# Patient Record
Sex: Male | Born: 1950 | Race: White | Hispanic: No | Smoking: Never smoker
Health system: Southern US, Community
[De-identification: ages and names within clinical notes are randomized; demographics above are authoritative.]

## PROBLEM LIST (undated history)

## (undated) DIAGNOSIS — I639 Cerebral infarction, unspecified: Secondary | ICD-10-CM

## (undated) DIAGNOSIS — F29 Unspecified psychosis not due to a substance or known physiological condition: Secondary | ICD-10-CM

## (undated) DIAGNOSIS — G629 Polyneuropathy, unspecified: Secondary | ICD-10-CM

## (undated) DIAGNOSIS — E785 Hyperlipidemia, unspecified: Secondary | ICD-10-CM

## (undated) DIAGNOSIS — I10 Essential (primary) hypertension: Secondary | ICD-10-CM

## (undated) DIAGNOSIS — E119 Type 2 diabetes mellitus without complications: Secondary | ICD-10-CM

## (undated) DIAGNOSIS — E11319 Type 2 diabetes mellitus with unspecified diabetic retinopathy without macular edema: Secondary | ICD-10-CM

## (undated) HISTORY — PX: BACK SURGERY: SHX140

---

## 2006-03-08 ENCOUNTER — Inpatient Hospital Stay: Payer: Self-pay | Admitting: Internal Medicine

## 2006-03-08 ENCOUNTER — Other Ambulatory Visit: Payer: Self-pay

## 2006-03-31 ENCOUNTER — Inpatient Hospital Stay: Payer: Self-pay | Admitting: Internal Medicine

## 2007-01-22 ENCOUNTER — Emergency Department: Payer: Self-pay | Admitting: Emergency Medicine

## 2007-09-15 ENCOUNTER — Ambulatory Visit: Payer: Self-pay

## 2007-10-16 ENCOUNTER — Other Ambulatory Visit: Payer: Self-pay

## 2007-10-16 ENCOUNTER — Emergency Department: Payer: Self-pay | Admitting: Emergency Medicine

## 2008-02-17 ENCOUNTER — Other Ambulatory Visit: Payer: Self-pay

## 2008-02-17 ENCOUNTER — Emergency Department: Payer: Self-pay | Admitting: Internal Medicine

## 2012-10-16 ENCOUNTER — Ambulatory Visit: Payer: Self-pay | Admitting: Family Medicine

## 2013-03-27 ENCOUNTER — Ambulatory Visit: Payer: Self-pay | Admitting: Ophthalmology

## 2013-04-09 ENCOUNTER — Ambulatory Visit: Payer: Self-pay | Admitting: Ophthalmology

## 2014-11-07 NOTE — Op Note (Signed)
PATIENT NAME:  Calvin Bates, Calvin Bates MR#:  [id number] DATE OF BIRTH:  Jun 11, 1951  DATE OF PROCEDURE:  04/09/2013  PREOPERATIVE DIAGNOSIS: Visually significant cataract of the left eye.   POSTOPERATIVE DIAGNOSIS: Visually significant cataract of the left eye.   OPERATIVE PROCEDURE: Cataract extraction by phacoemulsification with implant of intraocular lens to the left eye.   SURGEON: Galen ManilaWilliam Tariya Morrissette, MD  ANESTHESIA:  1. Managed anesthesia care.  2. 50-50 mixture of 0.75% bupivacaine and 4% Xylocaine given as a retrobulbar block.   COMPLICATIONS: None.   TECHNIQUE:  Stop and chop.  DESCRIPTION OF PROCEDURE: The patient was examined and consented for this procedure in the preoperative holding area and then brought back to the Operating Room where the anesthesia team employed managed anesthesia care.  3.5 milliliters of the aforementioned mixture were placed in the left orbit on an Atkinson needle without complication. The left eye was then prepped and draped in the usual sterile ophthalmic fashion. A lid speculum was placed. The side-port blade was used to create a paracentesis and the anterior chamber was filled with viscoelastic. The keratome was used to create a near clear corneal incision. The continuous curvilinear capsulorrhexis was performed with a cystotome followed by the capsulorrhexis forceps. Hydrodissection and hydrodelineation were carried out with BSS on a blunt cannula. The lens was removed in a stop and chop technique. The remaining cortical material was removed with the irrigation-aspiration handpiece. The capsular bag was inflated with viscoelastic and the Tecnis ZCB00 19.5-diopter lens, serial number [id number] was placed in the capsular bag without complication. The remaining viscoelastic was removed from the eye with the irrigation-aspiration handpiece. The wounds were hydrated. The anterior chamber was flushed with Miostat and the eye was inflated to a physiologic pressure. 0.1 mL of  cefuroxime concentration 10 mg/mL was placed in the anterior chamber. The wounds were found to be water tight. The eye was dressed with Vigamox followed by Maxitrol ointment and a protective shield was placed. The patient will followup with me in one day.     ____________________________ Jerilee FieldWilliam L. Dejon Jungman, MD wlp:dmm D: 04/09/2013 18:49:58 ET T: 04/09/2013 19:19:16 ET JOB#: [id number]  cc: Sheretta Grumbine L. Donivan Thammavong, MD, <Dictator> Jerilee FieldWILLIAM L Marily Konczal MD ELECTRONICALLY SIGNED 04/10/2013 17:26

## 2014-11-25 ENCOUNTER — Encounter: Payer: Self-pay | Admitting: Podiatry

## 2014-11-25 ENCOUNTER — Ambulatory Visit (INDEPENDENT_AMBULATORY_CARE_PROVIDER_SITE_OTHER): Payer: Medicare Other | Admitting: Podiatry

## 2014-11-25 VITALS — Ht 69.0 in | Wt 197.4 lb

## 2014-11-25 DIAGNOSIS — M79676 Pain in unspecified toe(s): Secondary | ICD-10-CM

## 2014-11-25 DIAGNOSIS — B351 Tinea unguium: Secondary | ICD-10-CM | POA: Diagnosis not present

## 2014-11-25 NOTE — Patient Instructions (Signed)
Diabetes and Foot Care Diabetes may cause you to have problems because of poor blood supply (circulation) to your feet and legs. This may cause the skin on your feet to become thinner, break easier, and heal more slowly. Your skin may become dry, and the skin may peel and crack. You may also have nerve damage in your legs and feet causing decreased feeling in them. You may not notice minor injuries to your feet that could lead to infections or more serious problems. Taking care of your feet is one of the most important things you can do for yourself.  HOME CARE INSTRUCTIONS  Wear shoes at all times, even in the house. Do not go barefoot. Bare feet are easily injured.  Check your feet daily for blisters, cuts, and redness. If you cannot see the bottom of your feet, use a mirror or ask someone for help.  Wash your feet with warm water (do not use hot water) and mild soap. Then pat your feet and the areas between your toes until they are completely dry. Do not soak your feet as this can dry your skin.  Apply a moisturizing lotion or petroleum jelly (that does not contain alcohol and is unscented) to the skin on your feet and to dry, brittle toenails. Do not apply lotion between your toes.  Trim your toenails straight across. Do not dig under them or around the cuticle. File the edges of your nails with an emery board or nail file.  Do not cut corns or calluses or try to remove them with medicine.  Wear clean socks or stockings every day. Make sure they are not too tight. Do not wear knee-high stockings since they may decrease blood flow to your legs.  Wear shoes that fit properly and have enough cushioning. To break in new shoes, wear them for just a few hours a day. This prevents you from injuring your feet. Always look in your shoes before you put them on to be sure there are no objects inside.  Do not cross your legs. This may decrease the blood flow to your feet.  If you find a minor scrape,  cut, or break in the skin on your feet, keep it and the skin around it clean and dry. These areas may be cleansed with mild soap and water. Do not cleanse the area with peroxide, alcohol, or iodine.  When you remove an adhesive bandage, be sure not to damage the skin around it.  If you have a wound, look at it several times a day to make sure it is healing.  Do not use heating pads or hot water bottles. They may burn your skin. If you have lost feeling in your feet or legs, you may not know it is happening until it is too late.  Make sure your health care provider performs a complete foot exam at least annually or more often if you have foot problems. Report any cuts, sores, or bruises to your health care provider immediately. SEEK MEDICAL CARE IF:   You have an injury that is not healing.  You have cuts or breaks in the skin.  You have an ingrown nail.  You notice redness on your legs or feet.  You feel burning or tingling in your legs or feet.  You have pain or cramps in your legs and feet.  Your legs or feet are numb.  Your feet always feel cold. SEEK IMMEDIATE MEDICAL CARE IF:   There is increasing redness,   swelling, or pain in or around a wound.  There is a red line that goes up your leg.  Pus is coming from a wound.  You develop a fever or as directed by your health care provider.  You notice a bad smell coming from an ulcer or wound. Document Released: 07/01/2000 Document Revised: 03/06/2013 Document Reviewed: 12/11/2012 Exit[employer] Patient Information 2015 [employer]. This information is not intended to replace advice given to you by your health care provider. Make sure you discuss any questions you have with your health care provider.  

## 2014-11-25 NOTE — Progress Notes (Signed)
   Subjective:    Patient ID: Calvin Bates, male    DOB: 08/07/1950, 64 y.o.   MRN: [id number]  HPI 64 year old male presents the office today with complaints of painful, elongated toenails which she is unable to trim himself. He denies any redness or drainage from the nail sites. He states his last blood sugar was 123. Denies a history of ulceration. No claudication symptoms. Last HbA1c 6.2. No other complaints at this time.   Review of Systems  Gastrointestinal: Positive for constipation.       Bloating   Musculoskeletal:       Back pain Muscle pain  All other systems reviewed and are negative.      Objective:   Physical Exam AAO 3, NAD DP/PT pulses palpable, CRT less than 3 seconds Protective sensation intact with Simms Weinstein monofilament, Achilles tendon reflex intact. Nails are hypertrophic, dystrophic, elongated, brittle, discolored 10. There is no surrounding erythema or drainage on the nail sites. There is tenderness to palpation overlying nails 1-5 bilaterally. No open lesions or pre-ulcerative lesions identified bilaterally. No other areas of tenderness to bilateral lower extremities. No overlying edema, erythema, increase in warmth. MMT 5/5, ROM WNL No pain with calf compression, swelling, warmth, erythema.    Assessment & Plan:   64 year old male with symptomatic onychomycosis -Treatment options were discussed including alternatives, risks, complications. -Nail sharply debrided 10 without complication/bleeding -Discussed the importance of daily foot inspection -Follow-up in 3 months or sooner if any problems are to arise. In the meantime, call the office with any questions, concerns, change in symptoms.

## 2015-02-24 ENCOUNTER — Ambulatory Visit: Payer: Medicare Other

## 2015-02-26 ENCOUNTER — Ambulatory Visit (INDEPENDENT_AMBULATORY_CARE_PROVIDER_SITE_OTHER): Payer: Medicare Other | Admitting: Podiatry

## 2015-02-26 ENCOUNTER — Encounter: Payer: Self-pay | Admitting: Podiatry

## 2015-02-26 DIAGNOSIS — M79676 Pain in unspecified toe(s): Secondary | ICD-10-CM | POA: Diagnosis not present

## 2015-02-26 DIAGNOSIS — B351 Tinea unguium: Secondary | ICD-10-CM

## 2015-02-26 NOTE — Progress Notes (Signed)
Patient ID: Calvin Bates, male   DOB: 1950/12/16, 64 y.o.   MRN: [id number]  Subjective: 64 y.o. returns the office today for painful, elongated, thickened toenails which he is unable to trim himself. Denies any redness or drainage around the nails. Denies any acute changes since last appointment and no new complaints today. Denies any systemic complaints such as fevers, chills, nausea, vomiting.   Objective: AAO 3, NAD DP/PT pulses palpable, CRT less than 3 seconds Nails hypertrophic, dystrophic, elongated, brittle, discolored 10. There is tenderness overlying the nails 1-5 bilaterally. There is no surrounding erythema or drainage along the nail sites. No open lesions or pre-ulcerative lesions are identified. No other areas of tenderness bilateral lower extremities. No overlying edema, erythema, increased warmth. No pain with calf compression, swelling, warmth, erythema.  Assessment: Patient presents with symptomatic onychomycosis  Plan: -Treatment options including alternatives, risks, complications were discussed -Nails sharply debrided 10 without complication/bleeding. -Discussed daily foot inspection. If there are any changes, to call the office immediately.  -Follow-up in 3 months or sooner if any problems are to arise. In the meantime, encouraged to call the office with any questions, concerns, changes symptoms.   Ovid Curd, DPM

## 2015-04-06 ENCOUNTER — Telehealth: Payer: Self-pay

## 2015-04-09 NOTE — Telephone Encounter (Signed)
error 

## 2015-06-04 ENCOUNTER — Ambulatory Visit (INDEPENDENT_AMBULATORY_CARE_PROVIDER_SITE_OTHER): Payer: Medicare Other | Admitting: Podiatry

## 2015-06-04 DIAGNOSIS — B351 Tinea unguium: Secondary | ICD-10-CM

## 2015-06-04 DIAGNOSIS — M79676 Pain in unspecified toe(s): Secondary | ICD-10-CM | POA: Diagnosis not present

## 2015-06-04 NOTE — Progress Notes (Signed)
Patient ID: Calvin HomansDonnie V Bates, male   DOB: 06/13/1951, 64 y.o.   MRN: [id number]  Subjective: 64 y.o. returns the office today for painful, elongated, thickened toenails which he is unable to trim himself. Denies any redness or drainage around the nails. Denies any acute changes since last appointment and no new complaints today. Denies any systemic complaints such as fevers, chills, nausea, vomiting.   Objective: AAO 3, NAD DP/PT pulses palpable, CRT less than 3 seconds Nails hypertrophic, dystrophic, elongated, brittle, discolored 10. There is tenderness overlying the nails 1-5 bilaterally. There is no surrounding erythema or drainage along the nail sites. Three is ecchymosis to the left and right 3rd toe without any pain, swelling, erythema.  No open lesions or pre-ulcerative lesions are identified. No other areas of tenderness bilateral lower extremities. No overlying edema, erythema, increased warmth. No pain with calf compression, swelling, warmth, erythema.  Assessment: Patient presents with symptomatic onychomycosis; toe contusion.   Plan: -Treatment options including alternatives, risks, complications were discussed -Nails sharply debrided 10 without complication/bleeding. -Recommended x-ray due to bruising but declined.  -Discussed daily foot inspection. If there are any changes, to call the office immediately.  -Follow-up in 3 months or sooner if any problems are to arise. In the meantime, encouraged to call the office with any questions, concerns, changes symptoms.  Ovid CurdMatthew Pasqual Farias, DPM

## 2015-09-08 ENCOUNTER — Ambulatory Visit (INDEPENDENT_AMBULATORY_CARE_PROVIDER_SITE_OTHER): Payer: Medicare Other | Admitting: Sports Medicine

## 2015-09-08 ENCOUNTER — Encounter: Payer: Self-pay | Admitting: Sports Medicine

## 2015-09-08 DIAGNOSIS — E1140 Type 2 diabetes mellitus with diabetic neuropathy, unspecified: Secondary | ICD-10-CM | POA: Diagnosis not present

## 2015-09-08 DIAGNOSIS — M79676 Pain in unspecified toe(s): Secondary | ICD-10-CM | POA: Diagnosis not present

## 2015-09-08 DIAGNOSIS — B351 Tinea unguium: Secondary | ICD-10-CM | POA: Diagnosis not present

## 2015-09-08 NOTE — Progress Notes (Signed)
Patient ID: Calvin Bates, male   DOB: 1951-05-06, 65 y.o.   MRN: [id number] Subjective: Calvin Bates is a 65 y.o. fe/male patient with history of type 2 diabetes who presents to office today complaining of long, painful nails  while ambulating in shoes; unable to trim. Patient states that the glucose reading this morning was 135 mg/dl. Patient denies any new changes in medication or new problems. Patient denies any new cramping, numbness, burning or tingling in the legs.  There are no active problems to display for this patient.  Current Outpatient Prescriptions on File Prior to Visit  Medication Sig Dispense Refill  . amLODipine (NORVASC) 5 MG tablet     . aspirin EC 81 MG tablet Take by mouth.    . Calcium-Magnesium-Vitamin D (CALCIUM 500 PO) Take 500 mg by mouth 2 (two) times daily.    . Hypromellose (NATURAL BALANCE TEARS OP) Apply 1 drop to eye 4 (four) times daily.    Marland Kitchen lisinopril (PRINIVIL,ZESTRIL) 40 MG tablet     . lovastatin (MEVACOR) 40 MG tablet     . Magnesium 250 MG TABS Take by mouth.    . metFORMIN (GLUCOPHAGE) 500 MG tablet     . metoprolol tartrate (LOPRESSOR) 25 MG tablet     . Multiple Vitamin (MULTIVITAMIN) capsule Take 1 capsule by mouth daily.    Marland Kitchen OLANZapine (ZYPREXA) 5 MG tablet Take by mouth.    . omega-3 acid ethyl esters (LOVAZA) 1 G capsule     . tamsulosin (FLOMAX) 0.4 MG CAPS capsule Take by mouth.    . thiamine 100 MG tablet Take by mouth.     No current facility-administered medications on file prior to visit.   No Known Allergies  Objective: General: Patient is awake, alert, and oriented x 3 and in no acute distress.  Integument: Skin is warm, dry and supple bilateral. Nails are tender, long, thickened and  dystrophic with subungual debris, consistent with onychomycosis, 1-5 bilateral. No signs of infection. No open lesions or preulcerative lesions present bilateral. Remaining integument unremarkable.  Vasculature:  Dorsalis Pedis pulse 2/4  bilateral. Posterior Tibial pulse  1/4 bilateral.  Capillary fill time <3 sec 1-5 bilateral. Scant hair growth to the level of the digits. Temperature gradient within normal limits. No varicosities present bilateral. No edema present bilateral.   Neurology: The patient has intact sensation measured with a 5.07/10g Semmes Weinstein Monofilament at all pedal sites bilateral . Vibratory sensation diminished bilateral with tuning fork. No Babinski sign present bilateral.   Musculoskeletal: Mild asymptomatic hammertoe pedal deformities noted bilateral. Muscular strength 5/5 in all lower extremity muscular groups bilateral without pain on range of motion . No tenderness with calf compression bilateral.  Assessment and Plan: Problem List Items Addressed This Visit    None    Visit Diagnoses    Dermatophytosis of nail    -  Primary    Pain of toe, unspecified laterality        Type 2 diabetes mellitus with diabetic neuropathy, without long-term current use of insulin (HCC)          -Examined patient. -Discussed and educated patient on diabetic foot care, especially with  regards to the vascular, neurological and musculoskeletal systems.  -Stressed the importance of good glycemic control and the detriment of not  controlling glucose levels in relation to the foot. -Mechanically debrided all nails 1-5 bilateral using sterile nail nipper and filed with dremel without incident  -Answered all patient questions -Patient to return  in 3 months for at risk foot care -Patient advised to call the office if any problems or questions arise in the meantime.  Asencion Islam, DPM

## 2015-12-08 ENCOUNTER — Ambulatory Visit (INDEPENDENT_AMBULATORY_CARE_PROVIDER_SITE_OTHER): Payer: Medicare Other | Admitting: Sports Medicine

## 2015-12-08 ENCOUNTER — Encounter: Payer: Self-pay | Admitting: Sports Medicine

## 2015-12-08 DIAGNOSIS — B351 Tinea unguium: Secondary | ICD-10-CM | POA: Diagnosis not present

## 2015-12-08 DIAGNOSIS — M79676 Pain in unspecified toe(s): Secondary | ICD-10-CM

## 2015-12-08 DIAGNOSIS — E1140 Type 2 diabetes mellitus with diabetic neuropathy, unspecified: Secondary | ICD-10-CM

## 2015-12-08 NOTE — Progress Notes (Signed)
Patient ID: Calvin Bates, male   DOB: 06/20/1951, 65 y.o.   MRN: [id number]  Subjective: Calvin Bates is a 65 y.o. male patient with history of type 2 diabetes who presents to office today complaining of long, painful nails  while ambulating in shoes; unable to trim. Patient states that the glucose reading this morning was 114 mg/dl. Patient denies any new changes in medication or new problems. Patient denies any new cramping, numbness, burning or tingling in the legs.  There are no active problems to display for this patient.  Current Outpatient Prescriptions on File Prior to Visit  Medication Sig Dispense Refill  . amLODipine (NORVASC) 5 MG tablet     . aspirin EC 81 MG tablet Take by mouth.    . Calcium-Magnesium-Vitamin D (CALCIUM 500 PO) Take 500 mg by mouth 2 (two) times daily.    . Hypromellose (NATURAL BALANCE TEARS OP) Apply 1 drop to eye 4 (four) times daily.    Marland Kitchen. lisinopril (PRINIVIL,ZESTRIL) 40 MG tablet     . lovastatin (MEVACOR) 40 MG tablet     . Magnesium 250 MG TABS Take by mouth.    . metFORMIN (GLUCOPHAGE) 500 MG tablet     . metoprolol tartrate (LOPRESSOR) 25 MG tablet     . Multiple Vitamin (MULTIVITAMIN) capsule Take 1 capsule by mouth daily.    Marland Kitchen. OLANZapine (ZYPREXA) 5 MG tablet Take by mouth.    . omega-3 acid ethyl esters (LOVAZA) 1 G capsule     . tamsulosin (FLOMAX) 0.4 MG CAPS capsule Take by mouth.    . thiamine 100 MG tablet Take by mouth.     No current facility-administered medications on file prior to visit.   No Known Allergies  Objective: General: Patient is awake, alert, and oriented x 3 and in no acute distress.  Integument: Skin is warm, dry and supple bilateral. Nails are tender, long, thickened and  dystrophic with subungual debris, consistent with onychomycosis, 1-5 bilateral. No signs of infection. No open lesions or preulcerative lesions present bilateral. Remaining integument unremarkable.  Vasculature:  Dorsalis Pedis pulse 2/4  bilateral. Posterior Tibial pulse  1/4 bilateral.  Capillary fill time <3 sec 1-5 bilateral. Scant hair growth to the level of the digits. Temperature gradient within normal limits. No varicosities present bilateral. No edema present bilateral.   Neurology: The patient has intact sensation measured with a 5.07/10g Semmes Weinstein Monofilament at all pedal sites bilateral . Vibratory sensation diminished bilateral with tuning fork. No Babinski sign present bilateral.   Musculoskeletal: Mild asymptomatic hammertoe pedal deformities noted bilateral. Muscular strength 5/5 in all lower extremity muscular groups bilateral without pain on range of motion . No tenderness with calf compression bilateral.  Assessment and Plan: Problem List Items Addressed This Visit    None    Visit Diagnoses    Dermatophytosis of nail    -  Primary    Pain of toe, unspecified laterality        Type 2 diabetes mellitus with diabetic neuropathy, without long-term current use of insulin (HCC)          -Examined patient. -Discussed and educated patient on diabetic foot care, especially with  regards to the vascular, neurological and musculoskeletal systems.  -Stressed the importance of good glycemic control and the detriment of not  controlling glucose levels in relation to the foot. -Mechanically debrided all nails 1-5 bilateral using sterile nail nipper and filed with dremel without incident  -Answered all patient questions -Patient to  return  in 3 months for at risk foot care -Patient advised to call the office if any problems or questions arise in the meantime.  Landis Martins, DPM

## 2016-03-11 ENCOUNTER — Encounter: Payer: Self-pay | Admitting: Sports Medicine

## 2016-03-11 ENCOUNTER — Ambulatory Visit (INDEPENDENT_AMBULATORY_CARE_PROVIDER_SITE_OTHER): Payer: Medicare Other | Admitting: Sports Medicine

## 2016-03-11 DIAGNOSIS — M79676 Pain in unspecified toe(s): Secondary | ICD-10-CM | POA: Diagnosis not present

## 2016-03-11 DIAGNOSIS — B351 Tinea unguium: Secondary | ICD-10-CM

## 2016-03-11 DIAGNOSIS — E1140 Type 2 diabetes mellitus with diabetic neuropathy, unspecified: Secondary | ICD-10-CM

## 2016-03-11 NOTE — Progress Notes (Signed)
Patient ID: Calvin Bates, male   DOB: 03/10/1951, 65 y.o.   MRN: [id number]  Subjective: Calvin Bates is a 65 y.o. male patient with history of type 2 diabetes who presents to office today complaining of long, painful nails  while ambulating in shoes; unable to trim. Patient states that the glucose reading this morning was not recorded. Patient denies any new changes in medication or new problems. Patient denies any new cramping, numbness, burning or tingling in the legs.  There are no active problems to display for this patient.  Current Outpatient Prescriptions on File Prior to Visit  Medication Sig Dispense Refill  . amLODipine (NORVASC) 5 MG tablet     . aspirin EC 81 MG tablet Take by mouth.    . Calcium-Magnesium-Vitamin D (CALCIUM 500 PO) Take 500 mg by mouth 2 (two) times daily.    . Hypromellose (NATURAL BALANCE TEARS OP) Apply 1 drop to eye 4 (four) times daily.    Marland Kitchen. lisinopril (PRINIVIL,ZESTRIL) 40 MG tablet     . lovastatin (MEVACOR) 40 MG tablet     . Magnesium 250 MG TABS Take by mouth.    . metFORMIN (GLUCOPHAGE) 500 MG tablet     . metoprolol tartrate (LOPRESSOR) 25 MG tablet     . Multiple Vitamin (MULTIVITAMIN) capsule Take 1 capsule by mouth daily.    Marland Kitchen. OLANZapine (ZYPREXA) 5 MG tablet Take by mouth.    . omega-3 acid ethyl esters (LOVAZA) 1 G capsule     . tamsulosin (FLOMAX) 0.4 MG CAPS capsule Take by mouth.    . thiamine 100 MG tablet Take by mouth.     No current facility-administered medications on file prior to visit.    No Known Allergies  Objective: General: Patient is awake, alert, and oriented x 3 and in no acute distress.  Integument: Skin is warm, dry and supple bilateral. Nails are tender, long, thickened and  dystrophic with subungual debris, consistent with onychomycosis, 1-5 bilateral. No signs of infection. No open lesions or preulcerative lesions present bilateral. Remaining integument unremarkable.  Vasculature:  Dorsalis Pedis pulse 2/4  bilateral. Posterior Tibial pulse  1/4 bilateral.  Capillary fill time <3 sec 1-5 bilateral. Scant hair growth to the level of the digits. Temperature gradient within normal limits. No varicosities present bilateral. No edema present bilateral.   Neurology: The patient has intact sensation measured with a 5.07/10g Semmes Weinstein Monofilament at all pedal sites bilateral . Vibratory sensation diminished bilateral with tuning fork. No Babinski sign present bilateral.   Musculoskeletal: Mild asymptomatic hammertoe pedal deformities noted bilateral. Muscular strength 5/5 in all lower extremity muscular groups bilateral without pain on range of motion . No tenderness with calf compression bilateral.  Assessment and Plan: Problem List Items Addressed This Visit    None    Visit Diagnoses    Dermatophytosis of nail    -  Primary   Pain of toe, unspecified laterality       Type 2 diabetes mellitus with diabetic neuropathy, without long-term current use of insulin (HCC)         -Examined patient. -Discussed and educated patient on diabetic foot care, especially with  regards to the vascular, neurological and musculoskeletal systems.  -Stressed the importance of good glycemic control and the detriment of not  controlling glucose levels in relation to the foot. -Mechanically debrided all nails 1-5 bilateral using sterile nail nipper and filed with dremel without incident  -Answered all patient questions -Patient to return  in 3 months for at risk foot care -Patient advised to call the office if any problems or questions arise in the meantime.  Asencion Islam, DPM

## 2016-06-17 ENCOUNTER — Ambulatory Visit (INDEPENDENT_AMBULATORY_CARE_PROVIDER_SITE_OTHER): Payer: Medicare Other | Admitting: Podiatry

## 2016-06-17 ENCOUNTER — Encounter: Payer: Self-pay | Admitting: Podiatry

## 2016-06-17 ENCOUNTER — Ambulatory Visit: Payer: Medicare Other | Admitting: Podiatry

## 2016-06-17 DIAGNOSIS — M79609 Pain in unspecified limb: Secondary | ICD-10-CM

## 2016-06-17 DIAGNOSIS — L600 Ingrowing nail: Secondary | ICD-10-CM | POA: Diagnosis not present

## 2016-06-17 DIAGNOSIS — B351 Tinea unguium: Secondary | ICD-10-CM | POA: Diagnosis not present

## 2016-06-17 DIAGNOSIS — L608 Other nail disorders: Secondary | ICD-10-CM | POA: Diagnosis not present

## 2016-06-17 DIAGNOSIS — L03039 Cellulitis of unspecified toe: Secondary | ICD-10-CM | POA: Diagnosis not present

## 2016-06-17 DIAGNOSIS — E0843 Diabetes mellitus due to underlying condition with diabetic autonomic (poly)neuropathy: Secondary | ICD-10-CM

## 2016-06-17 DIAGNOSIS — M79676 Pain in unspecified toe(s): Secondary | ICD-10-CM | POA: Diagnosis not present

## 2016-06-17 DIAGNOSIS — L603 Nail dystrophy: Secondary | ICD-10-CM

## 2016-06-17 NOTE — Patient Instructions (Addendum)
ANTIBACTERIAL SOAP INSTRUCTIONS  THE DAY AFTER PROCEDURE  Please follow the instructions your doctor has marked.   Shower as usual. Before getting out, place a drop of antibacterial liquid soap (Dial) on a wet, clean washcloth.  Gently wipe washcloth over affected area.  Afterward, rinse the area with warm water.  Blot the area dry with a soft cloth and cover with antibiotic ointment (neosporin, polysporin, bacitracin) and band aid or gauze and tape  Place 3-4 drops of antibacterial liquid soap in a quart of warm tap water.  Submerge foot into water for 20 minutes.  If bandage was applied after your procedure, leave on to allow for easy lift off, then remove and continue with soak for the remaining time.  Next, blot area dry with a soft cloth and cover with a bandage.  Apply other medications as directed by your doctor, such as cortisporin otic solution (eardrops) or neosporin antibiotic ointmentt

## 2016-06-19 NOTE — Progress Notes (Signed)
SUBJECTIVE Patient with a history of diabetes mellitus presents to office today complaining of elongated, thickened nails. Pain while ambulating in shoes. Patient is unable to trim their own nails.  Patient also has a new complaint of pain to the medial aspect of the second toenail right foot. Patient is concerned that he possibly has an ingrown toenail. Patient states that it is painful to walk.  No Known Allergies  OBJECTIVE General Patient is awake, alert, and oriented x 3 and in no acute distress. Derm incurvated toenail noted to the second digit medial aspect right foot. Localized erythema noted to the medial nail fold suggestive of a paronychia formation. Skin is dry and supple bilateral. Negative open lesions or macerations. Remaining integument unremarkable. Nails are tender, long, thickened and dystrophic with subungual debris, consistent with onychomycosis, 1-5 bilateral. No signs of infection noted. Vasc  DP and PT pedal pulses palpable bilaterally. Temperature gradient within normal limits.  Neuro Epicritic and protective threshold sensation diminished bilaterally.  Musculoskeletal Exam pain on palpation noted to the medial aspect of the second digit medial nail fold suggestive of ingrowing nail. No symptomatic pedal deformities noted bilateral. Muscular strength within normal limits.  ASSESSMENT 1. Diabetes Mellitus w/ peripheral neuropathy 2. Onychomycosis of nail due to dermatophyte bilateral 3. Pain in foot bilateral 4. Paronychia with ingrowing nail second digit right foot medial border 5. Pain in second digit right foot  PLAN OF CARE 1. Patient evaluated today. 2. Instructed to maintain good pedal hygiene and foot care. Stressed importance of controlling blood sugar.  3. Mechanical debridement of nails 1-5 bilaterally performed using a nail nipper. Filed with dremel without incident.  4. Partial permanent nail avulsion was performed to the medial aspect of the second digit  of the right foot using 330 seconds applications of phenol followed by alcohol flush. Prior to clinical procedure, digital block was performed using a 50-50 mixture of 2% lidocaine plain with 0.5% Marcaine plain. Light sterile dressing was applied. 5. Return to clinic in 2 weeks for ingrown follow-up evaluation   Felecia ShellingBrent M. Cainan Trull, DPM [facility]  Dr. Felecia ShellingBrent M. Fahmida Jurich, DPM   [address]                                        [place], [place] [address]                Office [phone]  Fax [phone]

## 2016-07-01 ENCOUNTER — Ambulatory Visit (INDEPENDENT_AMBULATORY_CARE_PROVIDER_SITE_OTHER): Payer: Medicare Other | Admitting: Podiatry

## 2016-07-01 DIAGNOSIS — S91109D Unspecified open wound of unspecified toe(s) without damage to nail, subsequent encounter: Secondary | ICD-10-CM

## 2016-07-01 DIAGNOSIS — M79676 Pain in unspecified toe(s): Secondary | ICD-10-CM

## 2016-07-01 DIAGNOSIS — S91209D Unspecified open wound of unspecified toe(s) with damage to nail, subsequent encounter: Secondary | ICD-10-CM | POA: Diagnosis not present

## 2016-07-03 NOTE — Progress Notes (Signed)

## 2016-10-03 ENCOUNTER — Ambulatory Visit (INDEPENDENT_AMBULATORY_CARE_PROVIDER_SITE_OTHER): Payer: Medicare Other | Admitting: Podiatry

## 2016-10-03 ENCOUNTER — Encounter: Payer: Self-pay | Admitting: Podiatry

## 2016-10-03 DIAGNOSIS — M79609 Pain in unspecified limb: Secondary | ICD-10-CM | POA: Diagnosis not present

## 2016-10-03 DIAGNOSIS — B351 Tinea unguium: Secondary | ICD-10-CM | POA: Diagnosis not present

## 2016-10-03 DIAGNOSIS — E0843 Diabetes mellitus due to underlying condition with diabetic autonomic (poly)neuropathy: Secondary | ICD-10-CM

## 2016-10-03 NOTE — Progress Notes (Signed)
Complaint:  Visit Type: Patient returns to my office for continued preventative foot care services. Complaint: Patient states" my nails have grown long and thick and become painful to walk and wear shoes" . The patient presents for preventative foot care services. No changes to ROS.  Patient is diabetic.  Podiatric Exam: Vascular: dorsalis pedis and posterior tibial pulses are palpable bilateral. Capillary return is immediate. Temperature gradient is WNL. Skin turgor WNL  Sensorium: Normal Semmes Weinstein monofilament test. Normal tactile sensation bilaterally. Nail Exam: Pt has thick disfigured discolored nails with subungual debris noted bilateral entire nail hallux through fifth toenails Ulcer Exam: There is no evidence of ulcer or pre-ulcerative changes or infection. Orthopedic Exam: Muscle tone and strength are WNL. No limitations in general ROM. No crepitus or effusions noted. Foot type and digits show no abnormalities. Bony prominences are unremarkable. Skin: No Porokeratosis. No infection or ulcers  Diagnosis:  Onychomycosis, , Pain in right toe, pain in left toes  Treatment & Plan Procedures and Treatment: Consent by patient was obtained for treatment procedures. The patient understood the discussion of treatment and procedures well. All questions were answered thoroughly reviewed. Debridement of mycotic and hypertrophic toenails, 1 through 5 bilateral and clearing of subungual debris. No ulceration, no infection noted.  Return Visit-Office Procedure: Patient instructed to return to the office for a follow up visit 3 months for continued evaluation and treatment.    Helane GuntherGregory Anapaula Severt DPM

## 2017-01-09 ENCOUNTER — Encounter: Payer: Self-pay | Admitting: Podiatry

## 2017-01-09 ENCOUNTER — Ambulatory Visit (INDEPENDENT_AMBULATORY_CARE_PROVIDER_SITE_OTHER): Payer: Medicare Other | Admitting: Podiatry

## 2017-01-09 DIAGNOSIS — B351 Tinea unguium: Secondary | ICD-10-CM | POA: Diagnosis not present

## 2017-01-09 DIAGNOSIS — M79609 Pain in unspecified limb: Secondary | ICD-10-CM

## 2017-01-09 NOTE — Progress Notes (Signed)
Complaint:  Visit Type: Patient returns to my office for continued preventative foot care services. Complaint: Patient states" my nails have grown long and thick and become painful to walk and wear shoes" . The patient presents for preventative foot care services. No changes to ROS.  Patient is diabetic.  Podiatric Exam: Vascular: dorsalis pedis and posterior tibial pulses are palpable bilateral. Capillary return is immediate. Temperature gradient is WNL. Skin turgor WNL  Sensorium: Normal Semmes Weinstein monofilament test. Normal tactile sensation bilaterally. Nail Exam: Pt has thick disfigured discolored nails with subungual debris noted bilateral entire nail hallux through fifth toenails Ulcer Exam: There is no evidence of ulcer or pre-ulcerative changes or infection. Orthopedic Exam: Muscle tone and strength are WNL. No limitations in general ROM. No crepitus or effusions noted. Foot type and digits show no abnormalities. Bony prominences are unremarkable. Skin: No Porokeratosis. No infection or ulcers  Diagnosis:  Onychomycosis, , Pain in right toe, pain in left toes  Treatment & Plan Procedures and Treatment: Consent by patient was obtained for treatment procedures. The patient understood the discussion of treatment and procedures well. All questions were answered thoroughly reviewed. Debridement of mycotic and hypertrophic toenails, 1 through 5 bilateral and clearing of subungual debris. No ulceration, no infection noted.  Return Visit-Office Procedure: Patient instructed to return to the office for a follow up visit 3 months for continued evaluation and treatment.    Helane GuntherGregory Ilanna Deihl DPM

## 2017-04-10 ENCOUNTER — Ambulatory Visit (INDEPENDENT_AMBULATORY_CARE_PROVIDER_SITE_OTHER): Payer: Medicare Other | Admitting: Podiatry

## 2017-04-10 ENCOUNTER — Encounter: Payer: Self-pay | Admitting: Podiatry

## 2017-04-10 DIAGNOSIS — M79609 Pain in unspecified limb: Secondary | ICD-10-CM | POA: Diagnosis not present

## 2017-04-10 DIAGNOSIS — B351 Tinea unguium: Secondary | ICD-10-CM

## 2017-04-10 NOTE — Progress Notes (Signed)
Complaint:  Visit Type: Patient returns to my office for continued preventative foot care services. Complaint: Patient states" my nails have grown long and thick and become painful to walk and wear shoes" . The patient presents for preventative foot care services. No changes to ROS.  Patient is diabetic.  Podiatric Exam: Vascular: dorsalis pedis and posterior tibial pulses are palpable bilateral. Capillary return is immediate. Temperature gradient is WNL. Skin turgor WNL  Sensorium: Normal Semmes Weinstein monofilament test. Normal tactile sensation bilaterally. Nail Exam: Pt has thick disfigured discolored nails with subungual debris noted bilateral entire nail hallux through fifth toenails Ulcer Exam: There is no evidence of ulcer or pre-ulcerative changes or infection. Orthopedic Exam: Muscle tone and strength are WNL. No limitations in general ROM. No crepitus or effusions noted. Foot type and digits show no abnormalities. Bony prominences are unremarkable. Skin: No Porokeratosis. No infection or ulcers  Diagnosis:  Onychomycosis, , Pain in right toe, pain in left toes  Treatment & Plan Procedures and Treatment: Consent by patient was obtained for treatment procedures. The patient understood the discussion of treatment and procedures well. All questions were answered thoroughly reviewed. Debridement of mycotic and hypertrophic toenails, 1 through 5 bilateral and clearing of subungual debris. No ulceration, no infection noted.  Return Visit-Office Procedure: Patient instructed to return to the office for a follow up visit 3 months for continued evaluation and treatment.    Helane Gunther DPM

## 2017-07-10 ENCOUNTER — Ambulatory Visit: Payer: Medicare Other | Admitting: Podiatry

## 2017-09-30 ENCOUNTER — Other Ambulatory Visit: Payer: Self-pay

## 2017-09-30 ENCOUNTER — Encounter: Payer: Self-pay | Admitting: Emergency Medicine

## 2017-09-30 ENCOUNTER — Emergency Department: Payer: Medicare Other

## 2017-09-30 ENCOUNTER — Emergency Department
Admission: EM | Admit: 2017-09-30 | Discharge: 2017-09-30 | Disposition: A | Discharge status: Home / Self Care | Payer: Medicare Other | Attending: Emergency Medicine | Admitting: Emergency Medicine

## 2017-09-30 DIAGNOSIS — Z7982 Long term (current) use of aspirin: Secondary | ICD-10-CM | POA: Diagnosis not present

## 2017-09-30 DIAGNOSIS — Z79899 Other long term (current) drug therapy: Secondary | ICD-10-CM | POA: Insufficient documentation

## 2017-09-30 DIAGNOSIS — E1140 Type 2 diabetes mellitus with diabetic neuropathy, unspecified: Secondary | ICD-10-CM | POA: Insufficient documentation

## 2017-09-30 DIAGNOSIS — I10 Essential (primary) hypertension: Secondary | ICD-10-CM | POA: Diagnosis not present

## 2017-09-30 DIAGNOSIS — R079 Chest pain, unspecified: Secondary | ICD-10-CM

## 2017-09-30 DIAGNOSIS — Z8673 Personal history of transient ischemic attack (TIA), and cerebral infarction without residual deficits: Secondary | ICD-10-CM | POA: Diagnosis not present

## 2017-09-30 DIAGNOSIS — E113299 Type 2 diabetes mellitus with mild nonproliferative diabetic retinopathy without macular edema, unspecified eye: Secondary | ICD-10-CM | POA: Insufficient documentation

## 2017-09-30 DIAGNOSIS — J40 Bronchitis, not specified as acute or chronic: Secondary | ICD-10-CM | POA: Diagnosis not present

## 2017-09-30 DIAGNOSIS — Z7984 Long term (current) use of oral hypoglycemic drugs: Secondary | ICD-10-CM | POA: Diagnosis not present

## 2017-09-30 HISTORY — DX: Type 2 diabetes mellitus without complications: E11.9

## 2017-09-30 HISTORY — DX: Type 2 diabetes mellitus with unspecified diabetic retinopathy without macular edema: E11.319

## 2017-09-30 HISTORY — DX: Cerebral infarction, unspecified: I63.9

## 2017-09-30 HISTORY — DX: Polyneuropathy, unspecified: G62.9

## 2017-09-30 HISTORY — DX: Unspecified psychosis not due to a substance or known physiological condition: F29

## 2017-09-30 HISTORY — DX: Essential (primary) hypertension: I10

## 2017-09-30 HISTORY — DX: Hyperlipidemia, unspecified: E78.5

## 2017-09-30 LAB — CBC
HEMATOCRIT: 40.2 % (ref 40.0–52.0)
Hemoglobin: 13.5 g/dL (ref 13.0–18.0)
MCH: 29.2 pg (ref 26.0–34.0)
MCHC: 33.7 g/dL (ref 32.0–36.0)
MCV: 86.9 fL (ref 80.0–100.0)
Platelets: 210 10*3/uL (ref 150–440)
RBC: 4.63 MIL/uL (ref 4.40–5.90)
RDW: 13.3 % (ref 11.5–14.5)
WBC: 7.8 10*3/uL (ref 3.8–10.6)

## 2017-09-30 LAB — BASIC METABOLIC PANEL
ANION GAP: 10 (ref 5–15)
BUN: 21 mg/dL — AB (ref 6–20)
CHLORIDE: 105 mmol/L (ref 101–111)
CO2: 23 mmol/L (ref 22–32)
Calcium: 9.5 mg/dL (ref 8.9–10.3)
Creatinine, Ser: 1.36 mg/dL — ABNORMAL HIGH (ref 0.61–1.24)
GFR, EST NON AFRICAN AMERICAN: 52 mL/min — AB (ref >60)
Glucose, Bld: 188 mg/dL — ABNORMAL HIGH (ref 65–99)
POTASSIUM: 4 mmol/L (ref 3.5–5.1)
Sodium: 138 mmol/L (ref 135–145)

## 2017-09-30 LAB — TROPONIN I: Troponin I: <0.03 ng/mL (ref <0.03)

## 2017-09-30 MED ORDER — IPRATROPIUM-ALBUTEROL 0.5-2.5 (3) MG/3ML IN SOLN
3.0000 mL | Freq: Once | RESPIRATORY_TRACT | Status: AC
  Administered 2017-09-30: 3 mL via RESPIRATORY_TRACT
  Filled 2017-09-30: qty 3

## 2017-09-30 MED ORDER — AZITHROMYCIN 500 MG PO TABS
500.0000 mg | ORAL_TABLET | Freq: Once | ORAL | Status: AC
  Administered 2017-09-30: 500 mg via ORAL
  Filled 2017-09-30: qty 1

## 2017-09-30 MED ORDER — AZITHROMYCIN 250 MG PO TABS: 250.0000 mg | ORAL_TABLET | Freq: Every day | ORAL | 0 refills | Status: AC

## 2017-09-30 MED ORDER — ALBUTEROL SULFATE HFA 108 (90 BASE) MCG/ACT IN AERS: 2.0000 | INHALATION_SPRAY | Freq: Four times a day (QID) | RESPIRATORY_TRACT | 0 refills | Status: DC | PRN

## 2017-09-30 NOTE — ED Triage Notes (Signed)
Pt to ED via POV c/o chest pain in the right side of his chest, weakness, and high blood sugar. Care giver reports that pts sugar was 242. Pt was c/o feeling faint around 1530. Pt currently in NAD.

## 2017-09-30 NOTE — ED Notes (Signed)
Pt placed in ED room 2.  VSS.  Denies pain.  No SOB or distress.  Assisted to stretcher and updated pt and wife on plan of care.

## 2017-09-30 NOTE — ED Notes (Signed)
Pt assisted up to toilet with front rolling wheeled walker.  Steady gait.  No SOB/DOE.

## 2017-09-30 NOTE — Discharge Instructions (Signed)
Please seek medical attention for any high fevers, chest pain, shortness of breath, change in behavior, persistent vomiting, bloody stool or any other new or concerning symptoms.  

## 2017-09-30 NOTE — ED Provider Notes (Signed)
[facility] Emergency Department Provider Note  ____________________________________________   I have reviewed the triage vital signs and the nursing notes.   HISTORY  Chief Complaint Chest Pain   History limited by: Not Limited   HPI Calvin Bates is a 67 y.o. male who presents to the emergency department today with primary complaint of chest pain. Located in the right chest. Described as mild. Started today. The pain has improved since it started. The patient also had some shortness of breath. In addition to these symptoms he was also found to have high blood sugar today. This was checked multiple times and was in the 240s. Normally he runs around 130s. No fevers.    Per medical record review patient has a history of DM.  Past Medical History:  Diagnosis Date  . Diabetes mellitus without complication (HCC)   . Diabetic retinopathy (HCC)   . Hyperlipemia   . Hypertension   . Neuropathy   . Psychosis (HCC)   . Stroke [facility](HCC)     There are no active problems to display for this patient.   Past Surgical History:  Procedure Laterality Date  . BACK SURGERY      Prior to Admission medications   Medication Sig Start Date End Date Taking? Authorizing Provider  amLODipine (NORVASC) 5 MG tablet  11/03/14   [provider]  aspirin EC 81 MG tablet Take by mouth.    [provider]  Calcium-Magnesium-Vitamin D (CALCIUM 500 PO) Take 500 mg by mouth 2 (two) times daily.    [provider]  Hypromellose (NATURAL BALANCE TEARS OP) Apply 1 drop to eye 4 (four) times daily.    [provider]  lisinopril (PRINIVIL,ZESTRIL) 40 MG tablet  11/03/14   [provider]  lovastatin (MEVACOR) 40 MG tablet  11/03/14   [provider]  Magnesium 250 MG TABS Take by mouth.    [provider]  metFORMIN (GLUCOPHAGE) 500 MG tablet  11/03/14   [provider]  metoprolol tartrate (LOPRESSOR) 25 MG tablet   11/03/14   [provider]  Multiple Vitamin (MULTIVITAMIN) capsule Take 1 capsule by mouth daily.    [provider]  OLANZapine (ZYPREXA) 5 MG tablet Take by mouth.    [provider]  omega-3 acid ethyl esters (LOVAZA) 1 G capsule  11/05/14   [provider]  tamsulosin (FLOMAX) 0.4 MG CAPS capsule Take by mouth.    [provider]  tamsulosin (FLOMAX) 0.4 MG CAPS capsule Take by mouth.    [provider]  thiamine 100 MG tablet Take by mouth.    [provider]    Allergies Patient has no known allergies.  No family history on file.  Social History Social History   Tobacco Use  . Smoking status: Never Smoker  . Smokeless tobacco: Never Used  Substance Use Topics  . Alcohol use: No    Alcohol/week: 0.0 oz    Frequency: Never  . Drug use: No    Review of Systems Constitutional: No fever/chills Eyes: No visual changes. ENT: No sore throat. Cardiovascular: Positive for chest pain. Respiratory: Positive for shortness of breath. Gastrointestinal: No abdominal pain.  No nausea, no vomiting.  No diarrhea.   Genitourinary: Negative for dysuria. Musculoskeletal: Negative for back pain. Skin: Negative for rash. Neurological: Negative for headaches, focal weakness or numbness.  ____________________________________________   PHYSICAL EXAM:  VITAL SIGNS: ED Triage Vitals [09/30/17 1648]  Enc Vitals Group     BP Marland Kitchen(!)  169/80     Pulse Rate 87     Resp 16     Temp 98.2 F (36.8 C)     Temp Source Oral     SpO2 98 %   Constitutional: Alert and oriented. Well appearing and in no distress. Eyes: Conjunctivae are normal.  ENT   Head: Normocephalic and atraumatic.   Nose: No congestion/rhinnorhea.   Mouth/Throat: Mucous membranes are moist.   Neck: No stridor. Hematological/Lymphatic/Immunilogical: No cervical lymphadenopathy. Cardiovascular: Normal rate, regular rhythm.  No murmurs, rubs, or gallops.   Respiratory: Normal respiratory effort without tachypnea nor retractions. Breath sounds are clear and equal bilaterally. No wheezes/rales/rhonchi. Gastrointestinal: Soft and non tender. No rebound. No guarding.  Genitourinary: Deferred Musculoskeletal: Normal range of motion in all extremities. No lower extremity edema. Neurologic:  Normal speech and language. No gross focal neurologic deficits are appreciated.  Skin:  Skin is warm, dry and intact. No rash noted. Psychiatric: Mood and affect are normal. Speech and behavior are normal. Patient exhibits appropriate insight and judgment.  ____________________________________________    LABS (pertinent positives/negatives)  Trop <0.03 CBC wnl BMP na 138, k 4.0, glu 188, cr 1.36  ____________________________________________   EKG  I, Phineas Semen, attending physician, personally viewed and interpreted this EKG  EKG Time: 1641 Rate: 88 Rhythm: normal sinus rhythm Axis: normal Intervals: qtc 411 QRS: narrow q waves III, aVF, v1, v2, v3 ST changes: no st elevation Impression: abnormal ekg  ____________________________________________    RADIOLOGY  CXR Concern for bronchitis   ____________________________________________   PROCEDURES  Procedures  ____________________________________________   INITIAL IMPRESSION / ASSESSMENT AND PLAN / ED COURSE  Pertinent labs & imaging results that were available during my care of the patient were reviewed by me and considered in my medical decision making (see chart for details).  Patient presented to the emergency department today because of concerns for right-sided chest pain.  Differential would be broad including cardiac etiology, pneumonia, pneumothorax, esophagitis, shingles amongst other etiologies.  Chest x-ray is concerning for possible bronchitis.  Patient was given DuoNeb and it seems to improve his symptoms slightly.  This point think bronchitis likely.  Will plan on  treating.  Discussed findings and plan with patient.  ____________________________________________   FINAL CLINICAL IMPRESSION(S) / ED DIAGNOSES  Final diagnoses:  Nonspecific chest pain  Bronchitis     Note: This dictation was prepared with Dragon dictation. Any transcriptional errors that result from this process are unintentional     Phineas Semen, MD 09/30/17 2249

## 2017-09-30 NOTE — ED Notes (Signed)
Dr. Goodman at bedside.  

## 2019-06-11 ENCOUNTER — Ambulatory Visit: Payer: Medicare Other | Admitting: Psychology

## 2020-06-29 ENCOUNTER — Ambulatory Visit: Payer: Medicare Other | Attending: Internal Medicine

## 2020-06-29 DIAGNOSIS — Z23 Encounter for immunization: Secondary | ICD-10-CM

## 2020-06-29 NOTE — Progress Notes (Signed)
   Covid-19 Vaccination Clinic  Name:  BASEM YANNUZZI    MRN: [id number] DOB: June 17, 1951  06/29/2020  Mr. Pinkerton was observed post Covid-19 immunization for 15 minutes without incident. He was provided with Vaccine Information Sheet and instruction to access the V-Safe system.   Mr. Lagace was instructed to call 911 with any severe reactions post vaccine: Marland Kitchen Difficulty breathing  . Swelling of face and throat  . A fast heartbeat  . A bad rash all over body  . Dizziness and weakness   Immunizations Administered    No immunizations on file.

## 2021-01-29 ENCOUNTER — Emergency Department: Payer: Medicare Other

## 2021-01-29 ENCOUNTER — Encounter: Payer: Self-pay | Admitting: Emergency Medicine

## 2021-01-29 ENCOUNTER — Other Ambulatory Visit: Payer: Self-pay

## 2021-01-29 DIAGNOSIS — Z7984 Long term (current) use of oral hypoglycemic drugs: Secondary | ICD-10-CM | POA: Diagnosis not present

## 2021-01-29 DIAGNOSIS — M6281 Muscle weakness (generalized): Secondary | ICD-10-CM | POA: Diagnosis present

## 2021-01-29 DIAGNOSIS — R55 Syncope and collapse: Secondary | ICD-10-CM | POA: Diagnosis not present

## 2021-01-29 DIAGNOSIS — E11319 Type 2 diabetes mellitus with unspecified diabetic retinopathy without macular edema: Secondary | ICD-10-CM | POA: Diagnosis not present

## 2021-01-29 DIAGNOSIS — Z7982 Long term (current) use of aspirin: Secondary | ICD-10-CM | POA: Diagnosis not present

## 2021-01-29 DIAGNOSIS — Z79899 Other long term (current) drug therapy: Secondary | ICD-10-CM | POA: Insufficient documentation

## 2021-01-29 DIAGNOSIS — I10 Essential (primary) hypertension: Secondary | ICD-10-CM | POA: Diagnosis not present

## 2021-01-29 DIAGNOSIS — I739 Peripheral vascular disease, unspecified: Secondary | ICD-10-CM | POA: Diagnosis not present

## 2021-01-29 LAB — PROTIME-INR
INR: 1.1 (ref 0.8–1.2)
Prothrombin Time: 14.3 seconds (ref 11.4–15.2)

## 2021-01-29 LAB — COMPREHENSIVE METABOLIC PANEL
ALT: 20 U/L (ref 0–44)
AST: 33 U/L (ref 15–41)
Albumin: 3.6 g/dL (ref 3.5–5.0)
Alkaline Phosphatase: 69 U/L (ref 38–126)
Anion gap: 10 (ref 5–15)
BUN: 39 mg/dL — ABNORMAL HIGH (ref 8–23)
CO2: 21 mmol/L — ABNORMAL LOW (ref 22–32)
Calcium: 9.2 mg/dL (ref 8.9–10.3)
Chloride: 103 mmol/L (ref 98–111)
Creatinine, Ser: 1.49 mg/dL — ABNORMAL HIGH (ref 0.61–1.24)
GFR, Estimated: 50 mL/min — ABNORMAL LOW (ref >60)
Glucose, Bld: 193 mg/dL — ABNORMAL HIGH (ref 70–99)
Potassium: 4.2 mmol/L (ref 3.5–5.1)
Sodium: 134 mmol/L — ABNORMAL LOW (ref 135–145)
Total Bilirubin: 1 mg/dL (ref 0.3–1.2)
Total Protein: 6.5 g/dL (ref 6.5–8.1)

## 2021-01-29 LAB — CBC
HCT: 35.1 % — ABNORMAL LOW (ref 39.0–52.0)
Hemoglobin: 11.8 g/dL — ABNORMAL LOW (ref 13.0–17.0)
MCH: 30.3 pg (ref 26.0–34.0)
MCHC: 33.6 g/dL (ref 30.0–36.0)
MCV: 90 fL (ref 80.0–100.0)
Platelets: 166 10*3/uL (ref 150–400)
RBC: 3.9 MIL/uL — ABNORMAL LOW (ref 4.22–5.81)
RDW: 13.2 % (ref 11.5–15.5)
WBC: 8.1 10*3/uL (ref 4.0–10.5)
nRBC: 0 % (ref 0.0–0.2)

## 2021-01-29 LAB — DIFFERENTIAL
Abs Immature Granulocytes: 0.03 10*3/uL (ref 0.00–0.07)
Basophils Absolute: 0.1 10*3/uL (ref 0.0–0.1)
Basophils Relative: 1 %
Eosinophils Absolute: 0.2 10*3/uL (ref 0.0–0.5)
Eosinophils Relative: 3 %
Immature Granulocytes: 0 %
Lymphocytes Relative: 7 %
Lymphs Abs: 0.6 10*3/uL — ABNORMAL LOW (ref 0.7–4.0)
Monocytes Absolute: 1 10*3/uL (ref 0.1–1.0)
Monocytes Relative: 12 %
Neutro Abs: 6.3 10*3/uL (ref 1.7–7.7)
Neutrophils Relative %: 77 %

## 2021-01-29 LAB — CBG MONITORING, ED: Glucose-Capillary: 182 mg/dL — ABNORMAL HIGH (ref 70–99)

## 2021-01-29 LAB — APTT: aPTT: 27 seconds (ref 24–36)

## 2021-01-29 MED ORDER — SODIUM CHLORIDE 0.9% FLUSH: 3.0000 mL | Freq: Once | INTRAVENOUS | Status: DC

## 2021-01-29 NOTE — ED Triage Notes (Signed)
Pt presents to ER from family care home [facility]. Pt accompanied by owner of family care home. Reports pt usually able to walk but this afternoon his legs gave up while transferring from lift chair. Pt's caregiver reports pt has neuropathy. Reports this evening around 9pm they attempted to transfer him from lift chair again and noticed his legs continued to show some weakness and left arm.

## 2021-01-30 ENCOUNTER — Emergency Department
Admission: EM | Admit: 2021-01-30 | Discharge: 2021-01-30 | Disposition: A | Discharge status: Home / Self Care | Payer: Medicare Other | Attending: Emergency Medicine | Admitting: Emergency Medicine

## 2021-01-30 DIAGNOSIS — I739 Peripheral vascular disease, unspecified: Secondary | ICD-10-CM

## 2021-01-30 DIAGNOSIS — R29898 Other symptoms and signs involving the musculoskeletal system: Secondary | ICD-10-CM

## 2021-01-30 NOTE — ED Provider Notes (Signed)
[facility] Emergency Department Provider Note   ____________________________________________   Event Date/Time   First MD Initiated Contact with Patient 01/30/21 0119     (approximate)  I have reviewed the triage vital signs and the nursing notes.   HISTORY  Chief Complaint Weakness    HPI Calvin Bates is a 70 y.o. male with the below stated past medical history who presents for bilateral lower extremity weakness  LOCATION: Bilateral lower extremities DURATION: 3 years TIMING: Worsened this morning SEVERITY: Moderate QUALITY: Weakness CONTEXT: Patient states he has had difficulty with strength in his legs over the past 2 years but had 2 episodes today where he felt like his legs gave out on him MODIFYING FACTORS: Standing from a seated position worsens this weakness and states that it improves gradually after standing and walking ASSOCIATED SYMPTOMS: Paresthesias in bilateral lower extremities   Per medical record review patient has history of peripheral arterial disease and diabetic neuropathy in both feet          Past Medical History:  Diagnosis Date   Diabetes mellitus without complication (HCC)    Diabetic retinopathy (HCC)    Hyperlipemia    Hypertension    Neuropathy    Psychosis (HCC)    Stroke (HCC)     There are no problems to display for this patient.   Past Surgical History:  Procedure Laterality Date   BACK SURGERY      Prior to Admission medications   Medication Sig Start Date End Date Taking? Authorizing Provider  albuterol (PROVENTIL HFA;VENTOLIN HFA) 108 (90 Base) MCG/ACT inhaler Inhale 2 puffs into the lungs every 6 (six) hours as needed for wheezing or shortness of breath. 09/30/17   Phineas Semen, MD  amLODipine (NORVASC) 10 MG tablet Take 10 mg by mouth daily. 01/05/21   [provider]  aspirin 81 MG EC tablet Take 81 mg by mouth daily.    [provider]  atorvastatin (LIPITOR) 40 MG  tablet Take 40 mg by mouth daily. 01/05/21   [provider]  lisinopril (PRINIVIL,ZESTRIL) 40 MG tablet Take 40 mg by mouth daily. 11/03/14   [provider]  magnesium oxide (MAG-OX) 400 MG tablet Take 1 tablet by mouth 2 (two) times daily. 10/16/20   [provider]  metFORMIN (GLUCOPHAGE) 500 MG tablet Take 500 mg by mouth 2 (two) times daily with a meal. 11/03/14   [provider]  metoprolol tartrate (LOPRESSOR) 25 MG tablet Take 25 mg by mouth 2 (two) times daily. 11/03/14   [provider]  Multiple Vitamin (MULTIVITAMIN) capsule Take 1 capsule by mouth daily.    [provider]  OLANZapine (ZYPREXA) 2.5 MG tablet Take 2.5 mg by mouth at bedtime. 01/05/21   [provider]  Omega-3 Fatty Acids (FISH OIL) 1000 MG CAPS Take 1 capsule by mouth 2 (two) times daily. 08/31/20   [provider]  tamsulosin (FLOMAX) 0.4 MG CAPS capsule Take 0.4 mg by mouth daily.    [provider]  thiamine 100 MG tablet Take 100 mg by mouth daily.    [provider]    Allergies Patient has no known allergies.  No family history on file.  Social History Social History   Tobacco Use   Smoking status: Never   Smokeless tobacco: Never  Substance Use Topics   Alcohol use: No    Alcohol/week: 0.0 standard drinks   Drug use: No    Review of Systems Constitutional: No  fever/chills Eyes: No visual changes. ENT: No sore throat. Cardiovascular: Denies chest pain. Respiratory: Denies shortness of breath. Gastrointestinal: No abdominal pain.  No nausea, no vomiting.  No diarrhea. Genitourinary: Negative for dysuria. Musculoskeletal: Negative for acute arthralgias Skin: Negative for rash. Neurological: Negative for headaches, endorses weakness and paresthesias in bilateral lower extremities Psychiatric: Negative for suicidal ideation/homicidal ideation   ____________________________________________   PHYSICAL  EXAM:  VITAL SIGNS: ED Triage Vitals  Enc Vitals Group     BP 01/29/21 2244 (!) 143/68     Pulse Rate 01/29/21 2244 88     Resp 01/29/21 2244 16     Temp 01/29/21 2244 99.1 F (37.3 C)     Temp Source 01/29/21 2244 Oral     SpO2 01/29/21 2244 97 %     Weight 01/29/21 2247 164 lb (74.4 kg)     Height 01/29/21 2247 5\' 9"  (1.753 m)     Head Circumference --      Peak Flow --      Pain Score 01/29/21 2246 4     Pain Loc --      Pain Edu? --      Excl. in GC? --    Constitutional: Alert and oriented. Well appearing and in no acute distress. Eyes: Conjunctivae are normal. PERRL. Head: Atraumatic. Nose: No congestion/rhinnorhea. Mouth/Throat: Mucous membranes are moist. Neck: No stridor Cardiovascular: Grossly normal heart sounds.  Decreased PT pulses in bilateral lower extremities.  2+ edema to bilateral lower extremities with right greater than left that patient states is baseline Respiratory: Normal respiratory effort.  No retractions. Gastrointestinal: Soft and nontender. No distention. Musculoskeletal: No obvious deformities Neurologic:  Normal speech and language. No gross focal neurologic deficits are appreciated. Skin:  Skin is warm and dry. No rash noted. Psychiatric: Mood and affect are normal. Speech and behavior are normal.  ____________________________________________   LABS (all labs ordered are listed, but only abnormal results are displayed)  Labs Reviewed  CBC - Abnormal; Notable for the following components:      Result Value   RBC 3.90 (*)    Hemoglobin 11.8 (*)    HCT 35.1 (*)    All other components within normal limits  DIFFERENTIAL - Abnormal; Notable for the following components:   Lymphs Abs 0.6 (*)    All other components within normal limits  COMPREHENSIVE METABOLIC PANEL - Abnormal; Notable for the following components:   Sodium 134 (*)    CO2 21 (*)    Glucose, Bld 193 (*)    BUN 39 (*)    Creatinine, Ser 1.49 (*)    GFR, Estimated 50 (*)     All other components within normal limits  CBG MONITORING, ED - Abnormal; Notable for the following components:   Glucose-Capillary 182 (*)    All other components within normal limits  PROTIME-INR  APTT  I-STAT CREATININE, ED   ____________________________________________  EKG  ED ECG REPORT I, 01/31/21, the attending physician, personally viewed and interpreted this ECG.  Date: 01/30/2021 EKG Time: 2323 Rate: 91 Rhythm: normal sinus rhythm QRS Axis: normal Intervals: normal ST/T Wave abnormalities: normal Narrative Interpretation: no evidence of acute ischemia  ____________________________________________  RADIOLOGY  ED MD interpretation: CT of the head without contrast shows no evidence of acute abnormalities including no intracerebral hemorrhage, obvious masses, or significant edema  Official radiology report(s): CT HEAD WO CONTRAST  Result Date: 01/29/2021 CLINICAL DATA:  Gait abnormality, peripheral neuropathy EXAM: CT HEAD WITHOUT CONTRAST TECHNIQUE: Contiguous  axial images were obtained from the base of the skull through the vertex without intravenous contrast. COMPARISON:  10/16/2007 FINDINGS: Brain: Normal anatomic configuration. Moderate parenchymal volume loss is slightly progressive since prior examination. Moderate periventricular white matter changes are present likely reflecting the sequela of small vessel ischemia. Remote lacunar infarct within the right corona radiata is stable. No abnormal intra or extra-axial mass lesion or fluid collection. No abnormal mass effect or midline shift. No evidence of acute intracranial hemorrhage or infarct. Ventricular size is normal. Cerebellum unremarkable. Vascular: No asymmetric hyperdense vasculature at the skull base. Skull: Intact Sinuses/Orbits: Paranasal sinuses are clear. Orbits are unremarkable. Other: Mastoid air cells and middle ear cavities are clear. IMPRESSION: No acute intracranial hemorrhage or infarct.  Moderate senescent change, slightly progressive since prior examination. Stable remote lacunar infarct. Electronically Signed   By: Helyn Numbers MD   On: 01/29/2021 23:46    ____________________________________________   PROCEDURES  Procedure(s) performed (including Critical Care):  .1-3 Lead EKG Interpretation  Date/Time: 01/30/2021 1:25 AM Performed by: Merwyn Katos, MD Authorized by: Merwyn Katos, MD     Interpretation: normal     ECG rate:  82   ECG rate assessment: normal     Rhythm: sinus rhythm     Ectopy: none     Conduction: normal     ____________________________________________   INITIAL IMPRESSION / ASSESSMENT AND PLAN / ED COURSE  As part of my medical decision making, I reviewed the following data within the electronic medical record, if available:  Nursing notes reviewed and incorporated, Labs reviewed, EKG interpreted, Old chart reviewed, Radiograph reviewed and Notes from prior ED visits reviewed and incorporated        Presenting after a fall that occurred just prior to arrival, resulting in injury to the buttocks. The mechanism of injury was a mechanical ground level fall without syncope or near-syncope. The current level of pain is mild.  Bilateral lower extremity weakness likely due to peripheral arterial disease as well as diabetic neuropathy There was no loss of consciousness, confusion, seizure, or memory impairment. There is not a laceration associated with the injury. Denies neck pain. The patient does not take blood thinner medications. Denies vomiting, numbness/weakness, fever  Dispo: Discharge with PCP follow-up          ____________________________________________   FINAL CLINICAL IMPRESSION(S) / ED DIAGNOSES  Final diagnoses:  Weakness of both lower extremities  Peripheral arterial disease [facility])     ED Discharge Orders     None        Note:  This document was prepared using Dragon voice recognition software and  may include unintentional dictation errors.    Merwyn Katos, MD 01/30/21 [phone]

## 2021-02-08 ENCOUNTER — Encounter (INDEPENDENT_AMBULATORY_CARE_PROVIDER_SITE_OTHER): Payer: Medicare Other | Admitting: Nurse Practitioner

## 2021-02-11 ENCOUNTER — Other Ambulatory Visit: Payer: Self-pay

## 2021-02-11 ENCOUNTER — Ambulatory Visit (INDEPENDENT_AMBULATORY_CARE_PROVIDER_SITE_OTHER): Payer: Medicare Other | Admitting: Nurse Practitioner

## 2021-02-11 ENCOUNTER — Encounter (INDEPENDENT_AMBULATORY_CARE_PROVIDER_SITE_OTHER): Payer: Self-pay | Admitting: Nurse Practitioner

## 2021-02-11 VITALS — BP 131/78 | HR 59 | Ht 69.0 in | Wt 172.0 lb

## 2021-02-11 DIAGNOSIS — R29898 Other symptoms and signs involving the musculoskeletal system: Secondary | ICD-10-CM

## 2021-02-11 NOTE — Progress Notes (Signed)
Subjective:    Patient ID: Calvin Bates, male    DOB: Apr 18, 1951, 70 y.o.   MRN: [id number] Chief Complaint  Patient presents with   New Patient (Initial Visit)    NP consult onslut BIL weakness LE referred by Benn Moulder is a 70 year old male that presents today as referral from [facility] emergency room for evaluation for possible peripheral artery disease.  The patient notes that he has always had some lower extremity weakness however in his nursing facility they were trying to remove him from his lift chair and he was unable to stand.  The patient slid from the chair onto the ground.  This happened a second time which is concerning for the nursing facility staff and the took him to the emergency room for evaluation.  CT scan shows no evidence of an acute stroke.  It was felt by the ED provider that this could be related to peripheral neuropathy or peripheral arterial disease.  The patient does not walk extensively denies claudication-like symptoms he denies rest pain like symptoms.  He does endorse having a history of back pain and other issues with his back however.  He denies any fevers or chills.  He denies any injuries from his fall.   Review of Systems  Neurological:  Positive for weakness.  All other systems reviewed and are negative.     Objective:   Physical Exam Vitals reviewed.  HENT:     Head: Normocephalic.  Cardiovascular:     Rate and Rhythm: Normal rate.     Pulses:          Dorsalis pedis pulses are 1+ on the right side and 1+ on the left side.       Posterior tibial pulses are 2+ on the right side and 2+ on the left side.  Pulmonary:     Effort: Pulmonary effort is normal.  Musculoskeletal:     Right lower leg: 2+ Edema present.     Left lower leg: 1+ Edema present.  Neurological:     Mental Status: He is alert and oriented to person, place, and time.  Psychiatric:        Mood and Affect: Mood normal.        Behavior:  Behavior normal.        Thought Content: Thought content normal.        Judgment: Judgment normal.    BP 131/78   Pulse (!) 59   Ht 5\' 9"  (1.753 m)   Wt 172 lb (78 kg)   BMI 25.40 kg/m   Past Medical History:  Diagnosis Date   Diabetes mellitus without complication (HCC)    Diabetic retinopathy (HCC)    Hyperlipemia    Hypertension    Neuropathy    Psychosis (HCC)    Stroke (HCC)     Social History   Socioeconomic History   Marital status: Single    Spouse name: Not on file   Number of children: Not on file   Years of education: Not on file   Highest education level: Not on file  Occupational History   Not on file  Tobacco Use   Smoking status: Never   Smokeless tobacco: Never  Substance and Sexual Activity   Alcohol use: No    Alcohol/week: 0.0 standard drinks   Drug use: No   Sexual activity: Not on file  Other Topics Concern   Not on file  Social History Narrative  Not on file   Social Determinants of Health   Financial Resource Strain: Not on file  Food Insecurity: Not on file  Transportation Needs: Not on file  Physical Activity: Not on file  Stress: Not on file  Social Connections: Not on file  Intimate Partner Violence: Not on file    Past Surgical History:  Procedure Laterality Date   BACK SURGERY      History reviewed. No pertinent family history.  No Known Allergies  CBC Latest Ref Rng & Units 01/29/2021 09/30/2017  WBC 4.0 - 10.5 K/uL 8.1 7.8  Hemoglobin 13.0 - 17.0 g/dL 11.8(L) 13.5  Hematocrit 39.0 - 52.0 % 35.1(L) 40.2  Platelets 150 - 400 K/uL 166 210      CMP     Component Value Date/Time   NA 134 (L) 01/29/2021 2254   K 4.2 01/29/2021 2254   CL 103 01/29/2021 2254   CO2 21 (L) 01/29/2021 2254   GLUCOSE 193 (H) 01/29/2021 2254   BUN 39 (H) 01/29/2021 2254   CREATININE 1.49 (H) 01/29/2021 2254   CALCIUM 9.2 01/29/2021 2254   PROT 6.5 01/29/2021 2254   ALBUMIN 3.6 01/29/2021 2254   AST 33 01/29/2021 2254   ALT 20  01/29/2021 2254   ALKPHOS 69 01/29/2021 2254   BILITOT 1.0 01/29/2021 2254   GFRNONAA 50 (L) 01/29/2021 2254   GFRAA >60 09/30/2017 1657     No results found.     Assessment & Plan:   1. Weakness of both lower extremities Based on the patient's description of symptoms it is possible that it is related to diminished perfusion.  However the patient also does have substantial back pain so this could be related to spinal stenosis or some other neurological condition.  We will perform ABIs as well as an aortoiliac duplex to not only assess overall blood flow but evaluate for possible aortic/iliac occlusions.  Patient follow-up with [place] [facility]. - VAS US AORTA/IVC/ILIACS; Future - VAS [place] ABI WITH/WO TBI; Future   Current Outpatient Medications on File Prior to Visit  Medication Sig Dispense Refill   amLODipine (NORVASC) 10 MG tablet Take 10 mg by mouth daily.     aspirin 81 MG EC tablet Take 81 mg by mouth daily.     atorvastatin (LIPITOR) 40 MG tablet Take 40 mg by mouth daily.     lisinopril (PRINIVIL,ZESTRIL) 40 MG tablet Take 40 mg by mouth daily.     magnesium oxide (MAG-OX) 400 MG tablet Take 1 tablet by mouth 2 (two) times daily.     metFORMIN (GLUCOPHAGE) 500 MG tablet Take 500 mg by mouth 2 (two) times daily with a meal.     metoprolol tartrate (LOPRESSOR) 25 MG tablet Take 25 mg by mouth 2 (two) times daily.     Multiple Vitamin (MULTIVITAMIN) capsule Take 1 capsule by mouth daily.     OLANZapine (ZYPREXA) 2.5 MG tablet Take 2.5 mg by mouth at bedtime.     Omega-3 Fatty Acids (FISH OIL) 1000 MG CAPS Take 1 capsule by mouth 2 (two) times daily.     tamsulosin (FLOMAX) 0.4 MG CAPS capsule Take 0.4 mg by mouth daily.     thiamine 100 MG tablet Take 100 mg by mouth daily.     albuterol (PROVENTIL HFA;VENTOLIN HFA) 108 (90 Base) MCG/ACT inhaler Inhale 2 puffs into the lungs every 6 (six) hours as needed for wheezing or shortness of breath. (Patient not taking: Reported on  02/11/2021) 1 Inhaler 0   No  current facility-administered medications on file prior to visit.    There are no Patient Instructions on file for this visit. No follow-ups on file.   Kris Hartmann, NP

## 2021-02-19 ENCOUNTER — Other Ambulatory Visit: Payer: Self-pay

## 2021-02-19 ENCOUNTER — Ambulatory Visit (INDEPENDENT_AMBULATORY_CARE_PROVIDER_SITE_OTHER): Payer: Medicare Other | Admitting: Nurse Practitioner

## 2021-02-19 ENCOUNTER — Ambulatory Visit (INDEPENDENT_AMBULATORY_CARE_PROVIDER_SITE_OTHER): Payer: Medicare Other

## 2021-02-19 VITALS — BP 134/75 | HR 61 | Resp 16 | Wt 172.2 lb

## 2021-02-19 DIAGNOSIS — R29898 Other symptoms and signs involving the musculoskeletal system: Secondary | ICD-10-CM

## 2021-02-28 ENCOUNTER — Encounter (INDEPENDENT_AMBULATORY_CARE_PROVIDER_SITE_OTHER): Payer: Self-pay | Admitting: Nurse Practitioner

## 2021-02-28 NOTE — Progress Notes (Signed)
Subjective:    Patient ID: Calvin Bates, male    DOB: January 13, 1951, 70 y.o.   MRN: [id number] Chief Complaint  Patient presents with   Follow-up    Ultrasound follow up    Calvin Bates is a 70 year old male that presents today as referral from [facility] emergency room for evaluation for possible peripheral artery disease.  The patient notes that he has always had some lower extremity weakness however in his nursing facility they were trying to remove him from his lift chair and he was unable to stand.  The patient slid from the chair onto the ground.  This happened a second time which is concerning for the nursing facility staff and the took him to the emergency room for evaluation.  CT scan shows no evidence of an acute stroke.  It was felt by the ED provider that this could be related to peripheral neuropathy or peripheral arterial disease.  The patient does not walk extensively denies claudication-like symptoms he denies rest pain like symptoms.  He does endorse having a history of back pain and other issues with his back however.  He denies any fevers or chills.  He denies any injuries from his fall.  Since his last office visit the patient has not had any further falls  Today noninvasive studies show an ABI of 1.2 on the right and 1.28 on the left.  The patient has triphasic tibial artery waveforms with good toe waveforms bilaterally.  The patient also underwent an aortoiliac duplex which shows no evidence of significant stenosis within the aorta, external and common iliac arteries.  No evidence of an abdominal aortic aneurysm was noted with the largest measurement being 2.0 cm.     Review of Systems  Musculoskeletal:  Positive for gait problem.  Neurological:  Positive for weakness.  All other systems reviewed and are negative.     Objective:   Physical Exam Vitals reviewed.  HENT:     Head: Normocephalic.  Cardiovascular:     Rate and Rhythm: Normal rate.      Pulses: Decreased pulses.  Pulmonary:     Effort: Pulmonary effort is normal.  Musculoskeletal:     Right lower leg: Edema present.     Left lower leg: Edema present.  Skin:    General: Skin is warm and dry.  Neurological:     Mental Status: He is alert and oriented to person, place, and time.  Psychiatric:        Mood and Affect: Mood normal.        Behavior: Behavior normal.        Thought Content: Thought content normal.        Judgment: Judgment normal.    BP 134/75 (BP Location: Right Arm)   Pulse 61   Resp 16   Wt 172 lb 3.2 oz (78.1 kg)   BMI 25.43 kg/m   Past Medical History:  Diagnosis Date   Diabetes mellitus without complication (HCC)    Diabetic retinopathy (HCC)    Hyperlipemia    Hypertension    Neuropathy    Psychosis (HCC)    Stroke [facility])     Social History   Socioeconomic History   Marital status: Single    Spouse name: Not on file   Number of children: Not on file   Years of education: Not on file   Highest education level: Not on file  Occupational History   Not on file  Tobacco Use  Smoking status: Never   Smokeless tobacco: Never  Substance and Sexual Activity   Alcohol use: No    Alcohol/week: 0.0 standard drinks   Drug use: No   Sexual activity: Not on file  Other Topics Concern   Not on file  Social History Narrative   Not on file   Social Determinants of Health   Financial Resource Strain: Not on file  Food Insecurity: Not on file  Transportation Needs: Not on file  Physical Activity: Not on file  Stress: Not on file  Social Connections: Not on file  Intimate Partner Violence: Not on file    Past Surgical History:  Procedure Laterality Date   BACK SURGERY      History reviewed. No pertinent family history.  No Known Allergies  CBC Latest Ref Rng & Units 01/29/2021 09/30/2017  WBC 4.0 - 10.5 K/uL 8.1 7.8  Hemoglobin 13.0 - 17.0 g/dL 11.8(L) 13.5  Hematocrit 39.0 - 52.0 % 35.1(L) 40.2  Platelets 150 - 400 K/uL  166 210      CMP     Component Value Date/Time   NA 134 (L) 01/29/2021 2254   K 4.2 01/29/2021 2254   CL 103 01/29/2021 2254   CO2 21 (L) 01/29/2021 2254   GLUCOSE 193 (H) 01/29/2021 2254   BUN 39 (H) 01/29/2021 2254   CREATININE 1.49 (H) 01/29/2021 2254   CALCIUM 9.2 01/29/2021 2254   PROT 6.5 01/29/2021 2254   ALBUMIN 3.6 01/29/2021 2254   AST 33 01/29/2021 2254   ALT 20 01/29/2021 2254   ALKPHOS 69 01/29/2021 2254   BILITOT 1.0 01/29/2021 2254   GFRNONAA 50 (L) 01/29/2021 2254   GFRAA >60 09/30/2017 1657     No results found.     Assessment & Plan:   1. Weakness of both lower extremities Based on noninvasive studies today the patient does not have evidence of peripheral arterial disease.  There are certainly other conditions that could cause his lower extremity weakness among those being spinal stenosis given his history of lower back pain issues.  We will start by sending him to neurosurgery for further work-up and evaluation.  Otherwise patient should refer to primary care physician for further work-up and evaluation of weakness.  Patient will follow up on an as-needed basis. - Ambulatory referral to Neurosurgery   Current Outpatient Medications on File Prior to Visit  Medication Sig Dispense Refill   amLODipine (NORVASC) 10 MG tablet Take 10 mg by mouth daily.     aspirin 81 MG EC tablet Take 81 mg by mouth daily.     atorvastatin (LIPITOR) 40 MG tablet Take 40 mg by mouth daily.     fluticasone (FLONASE) 50 MCG/ACT nasal spray Place into both nostrils at bedtime.     lisinopril (PRINIVIL,ZESTRIL) 40 MG tablet Take 40 mg by mouth daily.     magnesium oxide (MAG-OX) 400 MG tablet Take 1 tablet by mouth 2 (two) times daily.     metFORMIN (GLUCOPHAGE) 500 MG tablet Take 500 mg by mouth 2 (two) times daily with a meal.     metoprolol tartrate (LOPRESSOR) 25 MG tablet Take 25 mg by mouth 2 (two) times daily.     Multiple Vitamin (MULTIVITAMIN) capsule Take 1 capsule by  mouth daily.     OLANZapine (ZYPREXA) 2.5 MG tablet Take 2.5 mg by mouth at bedtime.     Omega-3 Fatty Acids (FISH OIL) 1000 MG CAPS Take 1 capsule by mouth 2 (two) times daily.  tamsulosin (FLOMAX) 0.4 MG CAPS capsule Take 0.4 mg by mouth daily.     thiamine 100 MG tablet Take 100 mg by mouth daily.     albuterol (PROVENTIL HFA;VENTOLIN HFA) 108 (90 Base) MCG/ACT inhaler Inhale 2 puffs into the lungs every 6 (six) hours as needed for wheezing or shortness of breath. (Patient not taking: No sig reported) 1 Inhaler 0   No current facility-administered medications on file prior to visit.    There are no Patient Instructions on file for this visit. No follow-ups on file.   Georgiana Spinner, NP

## 2021-04-09 ENCOUNTER — Other Ambulatory Visit: Payer: Self-pay | Admitting: Neurosurgery

## 2021-04-09 ENCOUNTER — Other Ambulatory Visit (HOSPITAL_BASED_OUTPATIENT_CLINIC_OR_DEPARTMENT_OTHER): Payer: Self-pay | Admitting: Neurosurgery

## 2021-04-09 ENCOUNTER — Other Ambulatory Visit (HOSPITAL_COMMUNITY): Payer: Self-pay | Admitting: Neurosurgery

## 2021-04-09 DIAGNOSIS — M5416 Radiculopathy, lumbar region: Secondary | ICD-10-CM

## 2021-04-09 DIAGNOSIS — R29898 Other symptoms and signs involving the musculoskeletal system: Secondary | ICD-10-CM

## 2021-04-23 ENCOUNTER — Other Ambulatory Visit: Payer: Self-pay

## 2021-04-23 ENCOUNTER — Ambulatory Visit
Admission: RE | Admit: 2021-04-23 | Discharge: 2021-04-23 | Disposition: A | Discharge status: Home / Self Care | Payer: Medicare Other | Source: Ambulatory Visit | Attending: Neurosurgery | Admitting: Neurosurgery

## 2021-04-23 DIAGNOSIS — M5416 Radiculopathy, lumbar region: Secondary | ICD-10-CM | POA: Insufficient documentation

## 2021-04-23 DIAGNOSIS — R29898 Other symptoms and signs involving the musculoskeletal system: Secondary | ICD-10-CM | POA: Insufficient documentation

## 2021-04-28 ENCOUNTER — Other Ambulatory Visit (HOSPITAL_COMMUNITY): Payer: Self-pay | Admitting: Neurosurgery

## 2021-04-28 ENCOUNTER — Other Ambulatory Visit: Payer: Self-pay | Admitting: Neurosurgery

## 2021-04-28 DIAGNOSIS — M5416 Radiculopathy, lumbar region: Secondary | ICD-10-CM

## 2021-04-28 DIAGNOSIS — R29898 Other symptoms and signs involving the musculoskeletal system: Secondary | ICD-10-CM

## 2021-05-14 ENCOUNTER — Ambulatory Visit
Admission: RE | Admit: 2021-05-14 | Discharge: 2021-05-14 | Disposition: A | Discharge status: Home / Self Care | Payer: Medicare Other | Source: Ambulatory Visit | Attending: Neurosurgery | Admitting: Neurosurgery

## 2021-05-14 DIAGNOSIS — M5416 Radiculopathy, lumbar region: Secondary | ICD-10-CM | POA: Diagnosis present

## 2021-05-14 DIAGNOSIS — R29898 Other symptoms and signs involving the musculoskeletal system: Secondary | ICD-10-CM | POA: Diagnosis present

## 2021-11-22 ENCOUNTER — Telehealth: Payer: Self-pay

## 2021-11-22 NOTE — Telephone Encounter (Signed)
Copied from CRM [id number]. Topic: Appointment Scheduling - Scheduling Inquiry for Clinic >> Nov 22, 2021  2:33 PM Aretta Nip wrote: Reason for CRM: [id number] Pls call pt in re to sch a New Pt appt. For Dr Caralee Ates

## 2021-11-22 NOTE — Telephone Encounter (Signed)
Left vm to call us back to schedule NP appt

## 2022-04-19 ENCOUNTER — Ambulatory Visit: Payer: Medicare Other | Admitting: Psychiatry

## 2022-05-11 NOTE — Progress Notes (Signed)
Psychiatric Initial Adult Assessment   Patient Identification: Calvin Bates MRN:  [id number] Date of Evaluation:  05/16/2022 Referral Source: Elba Barman, MD  Chief Complaint:   Chief Complaint  Patient presents with   Establish Care   Visit Diagnosis:    ICD-10-CM   1. Schizophrenia, unspecified type ([place])  F20.9     2. Tardive dyskinesia  G24.01       History of Present Illness:   Calvin Bates is a 71 y.o. year old male with a history of psychosis,  tardive dyskinesia, CVA, diabetes, hyperlipidemia, hypertension, who is referred for schizophrenia.  - reviewed notes from [place]. Diagnosis includes mood swing, psychosis. No detailed information about his symptoms. Medication- olanzapine 2.5 mg at night .   He presents with a homeowner at the family care group home.  He invited her to speak for him.  She states that he used to be seen at [facility], and would like to transfer the care as they did not take his insurance anymore.  She has owned the practice for the past ten years.  He has not had any issues during this time as long as he is on the medication.  He gave her 30-day notice, stating that he will live by himself, and the telling that people are stealing food from him when he missed to take medication for 2 days.  He is back on olanzapine since then.  She states that his provider has been trying to taper down from 10 mg, and he is currently at 2.5 mg.  She heard from the previous order that he was in the closet, stripping, although it has not happened since she is with him.  She denies any safety concern.  Although he stays inside his room most of the time, he joins activities.  He does interact with other residents, and meets with his niece, who visits him.   He states that he started to see a psychiatrist since 1982. He states that he used to work at a [employer].  He did additional job, working in a [employer], putting cadmium in the battery.  He thinks it caused change in him  mentally.  When he was asked about any diagnosis of schizophrenia, he states that he was told that he has it.  Although he denies ever having hallucinations, paranoia or ideas of reference, he later states that "they say I have all kinds of things."  He feels comfortable at the current home, and enjoys visitation from his niece, and visiting his sister.  He also enjoys reading books or watching TV.   PTSD-he reports he had some trauma in the past, although he does not elaborate it.  He tries not to think about it.  He denies any other PTSD symptoms.   Substance-he denies alcohol or drug use.   Medication- olanzapine 2.5 mg (tapered down from 10 mg, currently at this dose for more than one year. )    Household: (family home) Marital status:divorced, married three times Number of children:2 (estranged relationship) Employment: unemployed, on disability Education: 9th grade, (He left school having issues with some group of people who causes trouble). obtained GED   Last PCP / ongoing medical evaluation:     Associated Signs/Symptoms: Depression Symptoms:   denies  (Hypo) Manic Symptoms:   denies decreased need for sleep, euphoria Anxiety Symptoms:   denies Psychotic Symptoms:   denies AH, VH, paranoia, ideas of reference PTSD Symptoms: Had a traumatic exposure:  as above  Re-experiencing:  None Hypervigilance:  No Hyperarousal:  None Avoidance:  Decreased Interest/Participation  Past Psychiatric History:  Outpatient:  Psychiatry admission: several times "all over the [place] last in 2008 due to""out of my head." He reportedly tried to mix chloride with other chemical substance, although he denies this act as SI/HI.   Previous suicide attempt: denies  Past trials of medication: lithium History of violence:  denies Legal: none  Previous Psychotropic Medications: Yes   Substance Abuse History in the last 12 months:  No.  Consequences of Substance Abuse: NA  Past Medical History:   Past Medical History:  Diagnosis Date   Diabetes mellitus without complication (HCC)    Diabetic retinopathy (HCC)    Hyperlipemia    Hypertension    Neuropathy    Psychosis (HCC)    Stroke [facility])     Past Surgical History:  Procedure Laterality Date   BACK SURGERY      Family Psychiatric History: as below  Family History: No family history on file.  Social History:   Social History   Socioeconomic History   Marital status: Divorced    Spouse name: Not on file   Number of children: 2   Years of education: Not on file   Highest education level: GED or equivalent  Occupational History   Not on file  Tobacco Use   Smoking status: Never   Smokeless tobacco: Never  Substance and Sexual Activity   Alcohol use: No    Alcohol/week: 0.0 standard drinks of alcohol   Drug use: No   Sexual activity: Not Currently  Other Topics Concern   Not on file  Social History Narrative   Not on file   Social Determinants of Health   Financial Resource Strain: Not on file  Food Insecurity: Not on file  Transportation Needs: Not on file  Physical Activity: Not on file  Stress: Not on file  Social Connections: Not on file    Additional Social History: as above  Allergies:   Allergies  Allergen Reactions   Seasonal Ic [Octacosanol]     Metabolic Disorder Labs: No results found for: "HGBA1C", "MPG" No results found for: "PROLACTIN" No results found for: "CHOL", "TRIG", "HDL", "CHOLHDL", "VLDL", "LDLCALC" No results found for: "TSH"  Therapeutic Level Labs: No results found for: "LITHIUM" No results found for: "CBMZ" No results found for: "VALPROATE"  Current Medications: Current Outpatient Medications  Medication Sig Dispense Refill   acetaminophen (TYLENOL) 325 MG tablet Take 650 mg by mouth every 6 (six) hours as needed.     amLODipine (NORVASC) 10 MG tablet Take 10 mg by mouth daily.     Ascorbic Acid (VITAMIN C) 1000 MG tablet Take 1,000 mg by mouth daily.      aspirin 81 MG EC tablet Take 81 mg by mouth daily.     atorvastatin (LIPITOR) 40 MG tablet Take 40 mg by mouth daily.     cholecalciferol (VITAMIN D3) 25 MCG (1000 UNIT) tablet Take 1,000 Units by mouth daily.     fluticasone (FLONASE) 50 MCG/ACT nasal spray Place into both nostrils at bedtime.     guaifenesin (ROBITUSSIN) 100 MG/5ML syrup Take 200 mg by mouth 3 (three) times daily as needed for cough.     lisinopril (PRINIVIL,ZESTRIL) 40 MG tablet Take 40 mg by mouth daily.     loperamide (IMODIUM) 2 MG capsule Take by mouth as needed for diarrhea or loose stools.     magnesium hydroxide (MILK OF MAGNESIA) 400 MG/5ML suspension  Take by mouth daily as needed for mild constipation.     magnesium oxide (MAG-OX) 400 MG tablet Take 1 tablet by mouth 2 (two) times daily.     metFORMIN (GLUCOPHAGE) 500 MG tablet Take 500 mg by mouth 2 (two) times daily with a meal.     metoprolol tartrate (LOPRESSOR) 25 MG tablet Take 25 mg by mouth 2 (two) times daily.     Multiple Vitamin (MULTIVITAMIN) capsule Take 1 capsule by mouth daily.     OLANZapine (ZYPREXA) 2.5 MG tablet Take 1 tablet (2.5 mg total) by mouth at bedtime for 15 days. 15 tablet 0   Omega-3 Fatty Acids (FISH OIL) 1000 MG CAPS Take 1 capsule by mouth 2 (two) times daily.     tamsulosin (FLOMAX) 0.4 MG CAPS capsule Take 0.4 mg by mouth daily.     thiamine 100 MG tablet Take 100 mg by mouth daily.     albuterol (PROVENTIL HFA;VENTOLIN HFA) 108 (90 Base) MCG/ACT inhaler Inhale 2 puffs into the lungs every 6 (six) hours as needed for wheezing or shortness of breath. (Patient not taking: Reported on 05/16/2022) 1 Inhaler 0   [START ON 06/01/2022] OLANZapine (ZYPREXA) 2.5 MG tablet Take 1 tablet (2.5 mg total) by mouth at bedtime. 30 tablet 1   No current facility-administered medications for this visit.    Musculoskeletal: Strength & Muscle Tone: within normal limits Gait & Station: normal Patient leans: N/A  Psychiatric Specialty Exam: Review  of Systems  Psychiatric/Behavioral: Negative.    All other systems reviewed and are negative.   Blood pressure 135/67, pulse 67, temperature 98.2 F (36.8 C), temperature source Oral, height 5\' 9"  (1.753 m), weight 178 lb 6.4 oz (80.9 kg).Body mass index is 26.35 kg/m.  General Appearance: Fairly Groomed  Eye Contact:  Good  Speech:  Clear and Coherent  Volume:  Normal  Mood:   good  Affect:  Appropriate, Congruent, and calm, euthymic, reactive  Thought Process:  Coherent  Orientation:  Full (Time, Place, and Person)  Thought Content:  Logical  Suicidal Thoughts:  No  Homicidal Thoughts:  No  Memory:  Immediate;   Good  Judgement:  Good  Insight:  Fair  Psychomotor Activity:  TD +lip smacking, pill rolling tremor,   Concentration:  Concentration: Good and Attention Span: Good  Recall:  Good  Fund of Knowledge:Good  Language: Good  Akathisia:  No  Handed:  Right  AIMS (if indicated):  not done  Assets:  Communication Skills Desire for Improvement  ADL's:  Intact  Cognition: WNL  Sleep:  Good   Screenings: PHQ2-9    Flowsheet Row Office Visit from 05/16/2022 in [facility]  PHQ-2 Total Score 0      Flowsheet Row ED from [facility]/30/2021 in [facility]  C-SSRS RISK CATEGORY No Risk       Assessment and Plan:  Calvin Bates is a 72 y.o. year old male with a history of psychosis,  tardive dyskinesia, CVA, diabetes, hyperlipidemia, hypertension, who is referred for schizophrenia.   1. Schizophrenia, unspecified type (HCC) Exam is notable for calm demeanor with euthymic affect, and he demonstrates organized thought process during the visit.  According to the caregiver at the family home, he has been stable for ten years as long as he is on olanzapine.  He reportedly relapsed in his symptoms of having paranoia when he missed to take his medication for 2 days.  Will continue current dose of olanzapine to  target schizophrenia.  Discussed potential metabolic side effect, EPS.   2. Tardive dyskinesia Exam is notable for lip smacking, pill-rolling tremor.he reportedly has this at least for several months.  Although he was advised to try VMAT 2 inhibitor, he is not interested in this.  Discontinuation of olanzapine will not be a choice given his reported worsening in his symptoms as described above.  He verbalized understanding to monitor this moving forward.   Plan Continue olanzapine 2.5 mg at night  Next appointment: 1/11 at 3 Pm for 30 mins, in person  The patient demonstrates the following risk factors for suicide: Chronic risk factors for suicide include: psychiatric disorder of schizophrenia . Acute risk factors for suicide include: unemployment. Protective factors for this patient include: positive social support and hope for the future. Considering these factors, the overall suicide risk at this point appears to be low. Patient is appropriate for outpatient follow up.   Collaboration of Care: Other reviewed notes in Epic  Patient/Guardian was advised Release of Information must be obtained prior to any record release in order to collaborate their care with an outside provider. Patient/Guardian was advised if they have not already done so to contact the registration department to sign all necessary forms in order for [place] to release information regarding their care.   Consent: Patient/Guardian gives verbal consent for treatment and assignment of benefits for services provided during this visit. Patient/Guardian expressed understanding and agreed to proceed.   Neysa Hotter, MD 10/30/20234:44 PM

## 2022-05-16 ENCOUNTER — Encounter: Payer: Self-pay | Admitting: Psychiatry

## 2022-05-16 ENCOUNTER — Ambulatory Visit (INDEPENDENT_AMBULATORY_CARE_PROVIDER_SITE_OTHER): Payer: 59 | Admitting: Psychiatry

## 2022-05-16 VITALS — BP 135/67 | HR 67 | Temp 98.2°F | Ht 69.0 in | Wt 178.4 lb

## 2022-05-16 DIAGNOSIS — G2401 Drug induced subacute dyskinesia: Secondary | ICD-10-CM

## 2022-05-16 DIAGNOSIS — F209 Schizophrenia, unspecified: Secondary | ICD-10-CM | POA: Diagnosis not present

## 2022-05-16 MED ORDER — OLANZAPINE 2.5 MG PO TABS: 2.5000 mg | ORAL_TABLET | Freq: Every day | ORAL | 1 refills | Status: DC

## 2022-05-16 MED ORDER — OLANZAPINE 2.5 MG PO TABS: 2.5000 mg | ORAL_TABLET | Freq: Every day | ORAL | 0 refills | Status: DC

## 2022-05-16 NOTE — Patient Instructions (Signed)
Continue olanzapine 2.5 mg at night  Next appointment: 1/11 at [facility], in person

## 2022-05-24 ENCOUNTER — Telehealth: Payer: Self-pay

## 2022-05-24 NOTE — Telephone Encounter (Signed)
Please have them look at the order. I ordered both 15 tabs and 30 tabs based on their/facility request.

## 2022-05-24 NOTE — Telephone Encounter (Signed)
pt needs a 30 day supply of the olanzapine. the phamracy fixes his medication in pill packs and they don't have enought.

## 2022-07-27 NOTE — Progress Notes (Unsigned)
BH MD/PA/NP OP Progress Note  07/28/2022 3:45 PM Calvin Bates  MRN:  [id number]  Chief Complaint:  Chief Complaint  Patient presents with   Follow-up   HPI:  This is a follow-up appointment for schizophrenia. He states that he has been doing well.  He went to the niece on Christmas, and had a good time.  He is trying to stay away from everything.  He does not want to get involved with others.  He feels content, doing reading and watching TV (and laugh).  When he is asked about paranoia, he states that she "don't care (and laugh)," although he denies any paranoia.  He denies feeling depressed or anxiety.  He denies hallucinations.  He has occasional insomnia due to nocturia.  He feels comfortable to stay on the medication.  Of note, although he is aware of his tardive dyskinesia, it is not bothering and he is not interested in any medication.   Calvin Bates, who is seen at the group home presents to the visit.  She states that "Calvin Bates is Calvin Bates, for many years."  She denies any concern about him.  He likes to read his books.  Although medication is administered by the staff, he always counts his medication and find out which want to take.  She denies any safety concern.  He sleeps well and eats well.   Household: (family home) Marital status:divorced, married three times Number of children:2 (estranged relationship) Employment: unemployed, on disability Education: 9th grade, (He left school having issues with some group of people who causes trouble). obtained GED   Last PCP / ongoing medical evaluation  Wt Readings from Last 3 Encounters:  07/28/22 179 lb (81.2 kg)  05/16/22 178 lb 6.4 oz (80.9 kg)  02/19/21 172 lb 3.2 oz (78.1 kg)    Visit Diagnosis:    ICD-10-CM   1. Schizophrenia, unspecified type ([place])  F20.9       Past Psychiatric History: Please see initial evaluation for full details. I have reviewed the history. No updates at this time.     Past Medical History:  Past Medical  History:  Diagnosis Date   Diabetes mellitus without complication ([place])    Diabetic retinopathy ([place])    Hyperlipemia    Hypertension    Neuropathy    Psychosis ([place])    Stroke [facility])     Past Surgical History:  Procedure Laterality Date   BACK SURGERY      Family Psychiatric History: Please see initial evaluation for full details. I have reviewed the history. No updates at this time.     Family History: No family history on file.  Social History:  Social History   Socioeconomic History   Marital status: Divorced    Spouse name: Not on file   Number of children: 2   Years of education: Not on file   Highest education level: GED or equivalent  Occupational History   Not on file  Tobacco Use   Smoking status: Never   Smokeless tobacco: Never  Substance and Sexual Activity   Alcohol use: No    Alcohol/week: 0.0 standard drinks of alcohol   Drug use: No   Sexual activity: Not Currently  Other Topics Concern   Not on file  Social History Narrative   Not on file   Social Determinants of Health   Financial Resource Strain: Not on file  Food Insecurity: Not on file  Transportation Needs: Not on file  Physical Activity: Not on file  Stress:  Not on file  Social Connections: Not on file    Allergies:  Allergies  Allergen Reactions   Seasonal Ic [Octacosanol]     Metabolic Disorder Labs: No results found for: "HGBA1C", "MPG" No results found for: "PROLACTIN" No results found for: "CHOL", "TRIG", "HDL", "CHOLHDL", "VLDL", "LDLCALC" No results found for: "TSH"  Therapeutic Level Labs: No results found for: "LITHIUM" No results found for: "VALPROATE" No results found for: "CBMZ"  Current Medications: Current Outpatient Medications  Medication Sig Dispense Refill   acetaminophen (TYLENOL) 325 MG tablet Take 650 mg by mouth every 6 (six) hours as needed.     albuterol (PROVENTIL HFA;VENTOLIN HFA) 108 (90 Base) MCG/ACT inhaler Inhale 2 puffs into the lungs  every 6 (six) hours as needed for wheezing or shortness of breath. (Patient not taking: Reported on 05/16/2022) 1 Inhaler 0   amLODipine (NORVASC) 10 MG tablet Take 10 mg by mouth daily.     Ascorbic Acid (VITAMIN C) 1000 MG tablet Take 1,000 mg by mouth daily.     aspirin 81 MG EC tablet Take 81 mg by mouth daily.     atorvastatin (LIPITOR) 40 MG tablet Take 40 mg by mouth daily.     cholecalciferol (VITAMIN D3) 25 MCG (1000 UNIT) tablet Take 1,000 Units by mouth daily.     fluticasone (FLONASE) 50 MCG/ACT nasal spray Place into both nostrils at bedtime.     guaifenesin (ROBITUSSIN) 100 MG/5ML syrup Take 200 mg by mouth 3 (three) times daily as needed for cough.     lisinopril (PRINIVIL,ZESTRIL) 40 MG tablet Take 40 mg by mouth daily.     loperamide (IMODIUM) 2 MG capsule Take by mouth as needed for diarrhea or loose stools.     magnesium hydroxide (MILK OF MAGNESIA) 400 MG/5ML suspension Take by mouth daily as needed for mild constipation.     magnesium oxide (MAG-OX) 400 MG tablet Take 1 tablet by mouth 2 (two) times daily.     metFORMIN (GLUCOPHAGE) 500 MG tablet Take 500 mg by mouth 2 (two) times daily with a meal.     metoprolol tartrate (LOPRESSOR) 25 MG tablet Take 25 mg by mouth 2 (two) times daily.     Multiple Vitamin (MULTIVITAMIN) capsule Take 1 capsule by mouth daily.     OLANZapine (ZYPREXA) 2.5 MG tablet Take 1 tablet (2.5 mg total) by mouth at bedtime for 15 days. 15 tablet 0   [START ON 07/31/2022] OLANZapine (ZYPREXA) 2.5 MG tablet Take 1 tablet (2.5 mg total) by mouth at bedtime. 30 tablet 2   Omega-3 Fatty Acids (FISH OIL) 1000 MG CAPS Take 1 capsule by mouth 2 (two) times daily.     tamsulosin (FLOMAX) 0.4 MG CAPS capsule Take 0.4 mg by mouth daily.     thiamine 100 MG tablet Take 100 mg by mouth daily.     No current facility-administered medications for this visit.     Musculoskeletal: Strength & Muscle Tone: within normal limits Gait & Station:  uses a  walker Patient leans: N/A  Psychiatric Specialty Exam: Review of Systems  Psychiatric/Behavioral:  Negative for agitation, behavioral problems, confusion, decreased concentration, dysphoric mood, hallucinations, self-injury, sleep disturbance and suicidal ideas. The patient is not nervous/anxious and is not hyperactive.   All other systems reviewed and are negative.   Blood pressure (!) 145/77, pulse 60, temperature 98.3 F (36.8 C), height 5' 8.25" (1.734 m), weight 179 lb (81.2 kg), SpO2 98 %.Body mass index is 27.02 kg/m.  General Appearance: Fairly  Groomed  Eye Contact:  Good  Speech:  Clear and Coherent  Volume:  Normal  Mood:   good  Affect:  Appropriate, Congruent, and calm  Thought Process:  Coherent  Orientation:  Full (Time, Place, and Person)  Thought Content: Logical   Suicidal Thoughts:  No  Homicidal Thoughts:  No  Memory:  Immediate;   Good  Judgement:  Good  Insight:  Fair  Psychomotor Activity:  Normal  Concentration:  Concentration: Good and Attention Span: Good  Recall:  Good  Fund of Knowledge: Good  Language: Good  Akathisia:  No  Handed:  Right  AIMS (if indicated): 2 (extremity- upper  2)  Assets:  Communication Skills Desire for Improvement  ADL's:  Intact  Cognition: WNL  Sleep:  Good   Screenings: GAD-7    Flowsheet Row Office Visit from 07/28/2022 in [facility]  Total GAD-7 Score 0      PHQ2-9    [place] Office Visit from 07/28/2022 in [place] Office Visit from 05/16/2022 in [place]  PHQ-2 Total Score 0 0      [place] Office Visit from 07/28/2022 in [facility] ED from 01/30/2021 in [place] No Risk No Risk        Assessment and Plan:  Calvin Bates is a 72 y.o. year old male with a history of psychosis,  tardive dyskinesia, CVA,  diabetes, hyperlipidemia, hypertension, who presents for follow up appointment for below.   1. Schizophrenia, unspecified type ([place]) He demonstrates calm demeanor with euthymic affect with organized thought process, which is consistent with the initial interview. According to the caregiver at the family home, he has been stable for ten years as long as he is on olanzapine.  He reportedly relapsed in his symptoms of having paranoia when he missed to take his medication for 2 days.  Will continue current dose of olanzapine to target schizophrenia.   2. Tardive dyskinesia Unchanged. Exam is notable for lip smacking, pill-rolling tremor.he reportedly has this at least for several months.  Although he was advised to try VMAT 2 inhibitor, he is not interested in this.  Discontinuation of olanzapine will not be a choice given his reported worsening in his symptoms as described above.  He verbalized understanding to monitor this moving forward.    Plan Continue olanzapine 2.5 mg at night  Next appointment: 4/4 at 3 PM, in person The staff agrees to fax PCP note/labs   The patient demonstrates the following risk factors for suicide: Chronic risk factors for suicide include: psychiatric disorder of schizophrenia . Acute risk factors for suicide include: unemployment. Protective factors for this patient include: positive social support and hope for the future. Considering these factors, the overall suicide risk at this point appears to be low. Patient is appropriate for outpatient follow up.   This clinician has discussed the side effect associated with medication prescribed during this encounter. Please refer to notes in the previous encounters for more details.    Collaboration of Care: Collaboration of Care: Other reviewed notes in Epic  Patient/Guardian was advised Release of Information must be obtained prior to any record release in order to collaborate their care with an outside provider.  Patient/Guardian was advised if they have not already done so to contact the registration department to sign all necessary forms in order for [place] to release information regarding their care.   Consent:  Patient/Guardian gives verbal consent for treatment and assignment of benefits for services provided during this visit. Patient/Guardian expressed understanding and agreed to proceed.    Neysa Hotter, MD 07/28/2022, 3:45 PM

## 2022-07-28 ENCOUNTER — Ambulatory Visit (INDEPENDENT_AMBULATORY_CARE_PROVIDER_SITE_OTHER): Payer: 59 | Admitting: Psychiatry

## 2022-07-28 ENCOUNTER — Encounter: Payer: Self-pay | Admitting: Psychiatry

## 2022-07-28 VITALS — BP 145/77 | HR 60 | Temp 98.3°F | Ht 68.25 in | Wt 179.0 lb

## 2022-07-28 DIAGNOSIS — F209 Schizophrenia, unspecified: Secondary | ICD-10-CM

## 2022-07-28 MED ORDER — OLANZAPINE 2.5 MG PO TABS: 2.5000 mg | ORAL_TABLET | Freq: Every day | ORAL | 2 refills | Status: DC

## 2022-07-28 NOTE — Patient Instructions (Signed)
Continue olanzapine 2.5 mg at night  Next appointment: 4/4 at 3 PM

## 2022-09-18 IMAGING — CT CT HEAD W/O CM
3 series · 15 of 47 positions shown, 18 images · non-contrast
Comparison: 10/16/2007

CLINICAL DATA: Gait abnormality, peripheral neuropathy

EXAM:
CT HEAD WITHOUT CONTRAST
TECHNIQUE: Contiguous axial images were obtained from the base of the skull
through the vertex without intravenous contrast.

[Series 2: head wo · axial · 0.45mm/px · z∈[+588,+738]mm · 9 of 36 slices shown, 12 images]
[im 3/36  brain]
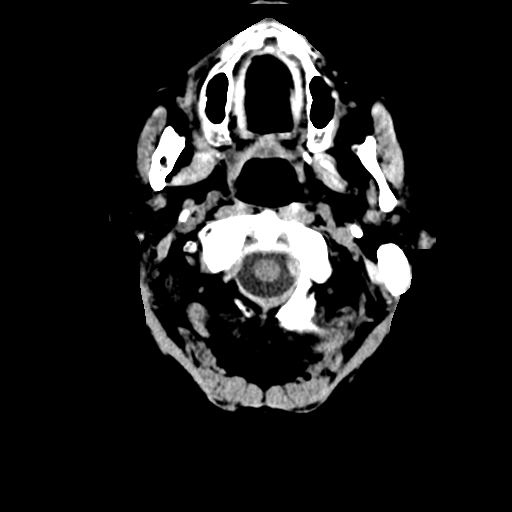
[im 3/36  bone]
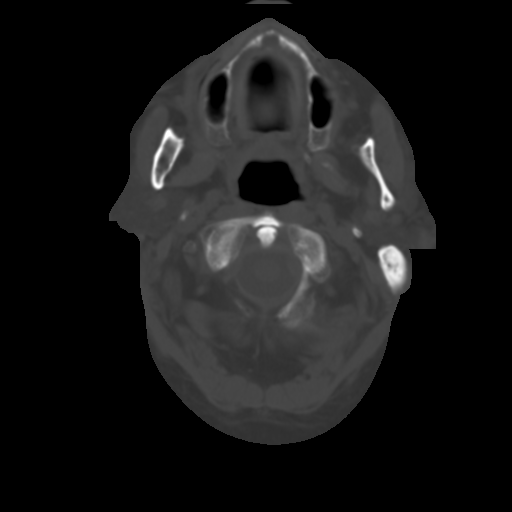
[im 7/36  brain]
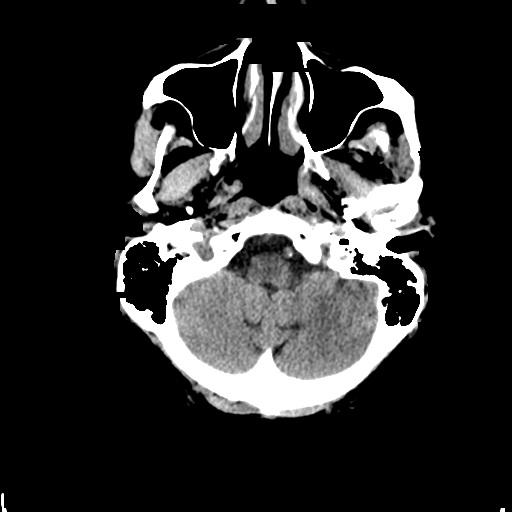
[im 10/36  brain]
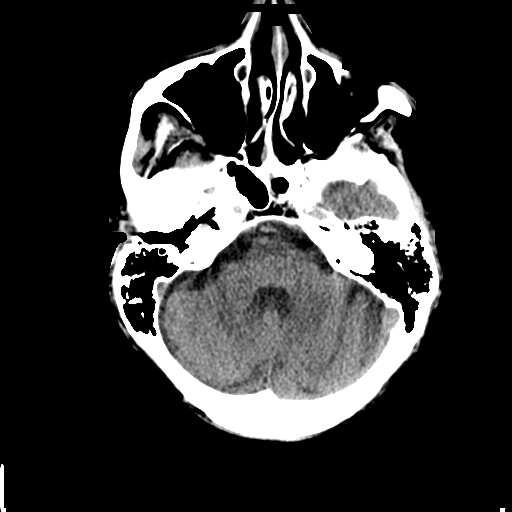
[im 14/36  brain]
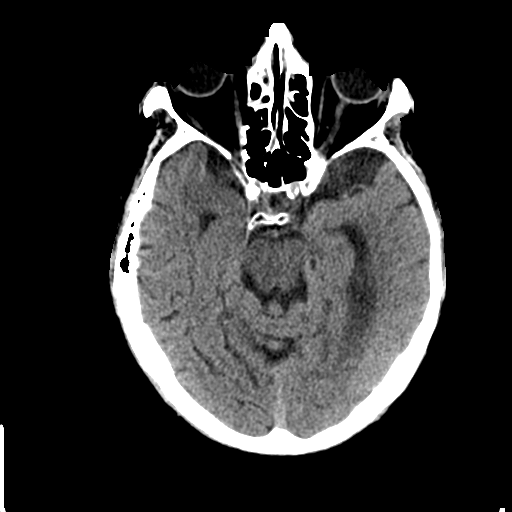
[im 19/36  brain]
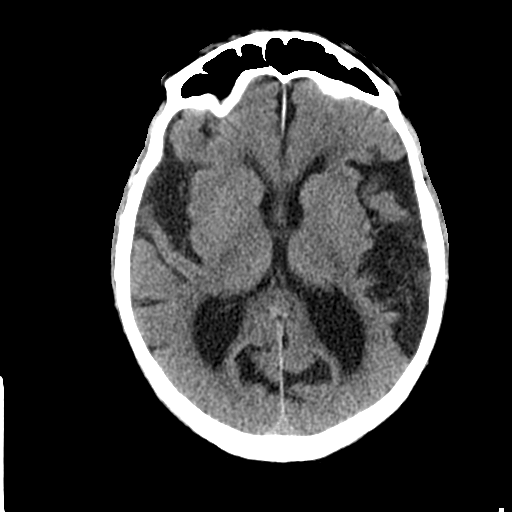
[im 19/36  bone]
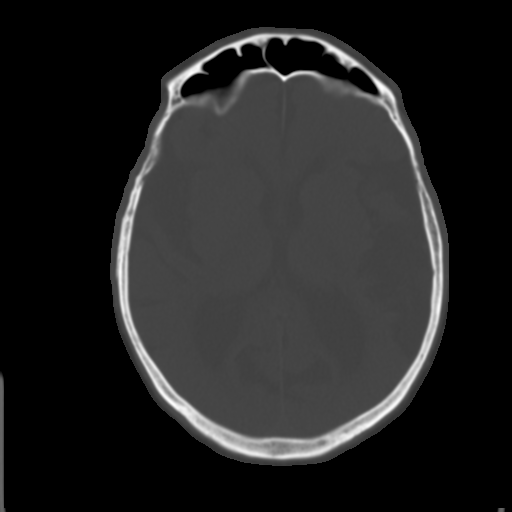
[im 22/36  brain]
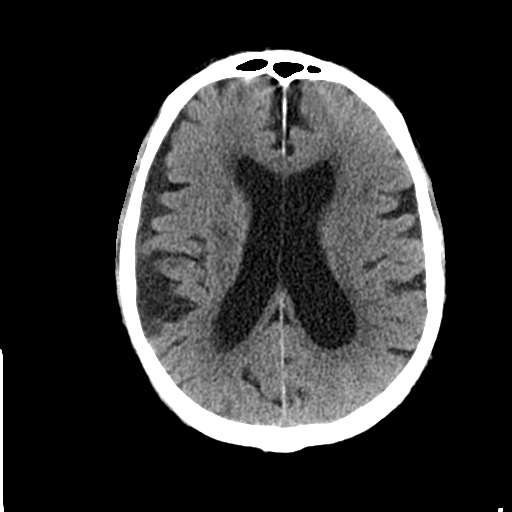
[im 26/36  brain]
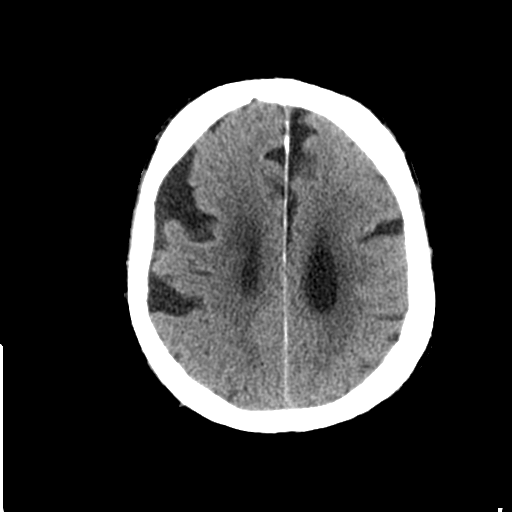
[im 29/36  brain]
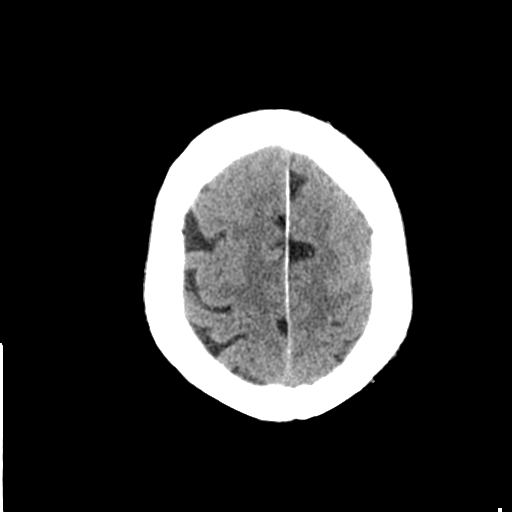
[im 33/36  brain]
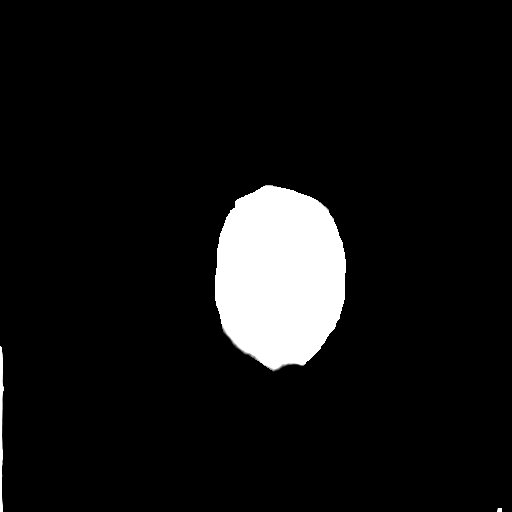
[im 33/36  bone]
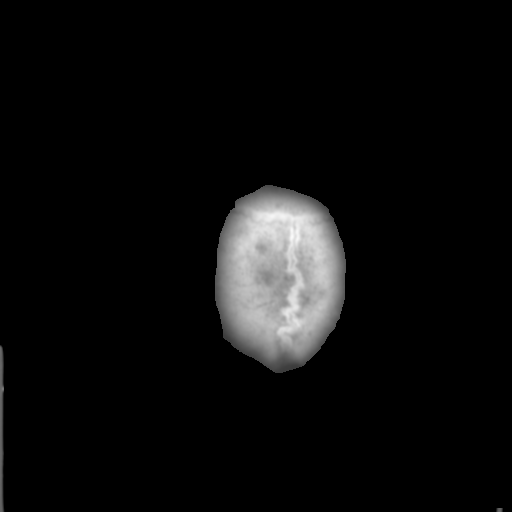

[Series 4: coronal soft tissue · coronal · 0.38mm/px · 3 of 77 slices shown]
[im 26/77  brain]
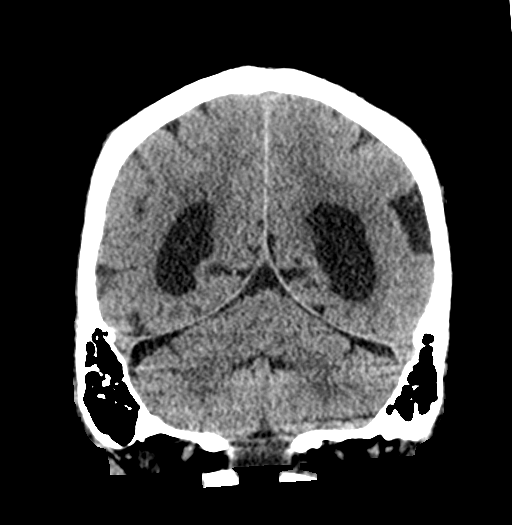
[im 34/77  brain]
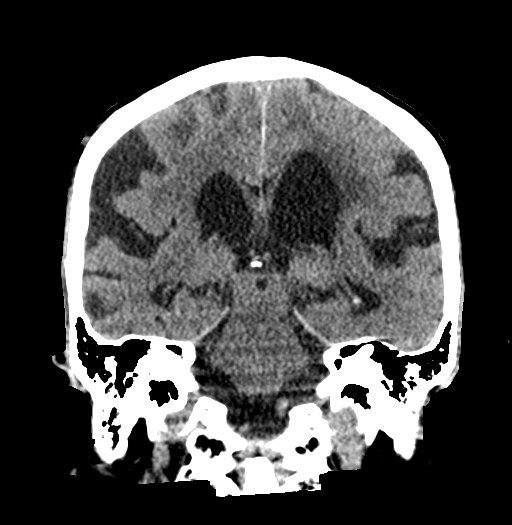
[im 43/77  brain]
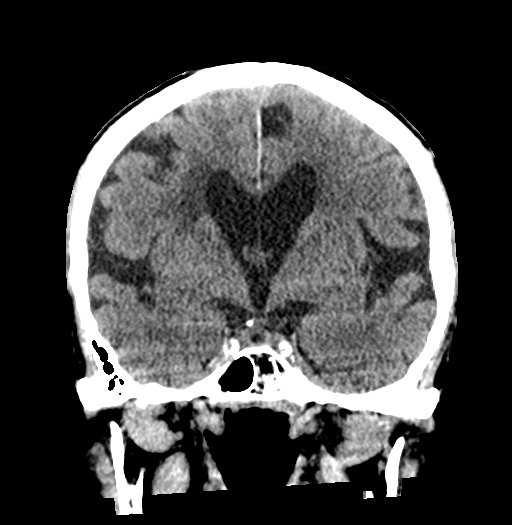

[Series 5: sagittal soft tissue · sagittal · 0.35mm/px · 3 of 64 slices shown]
[im 22/64  brain]
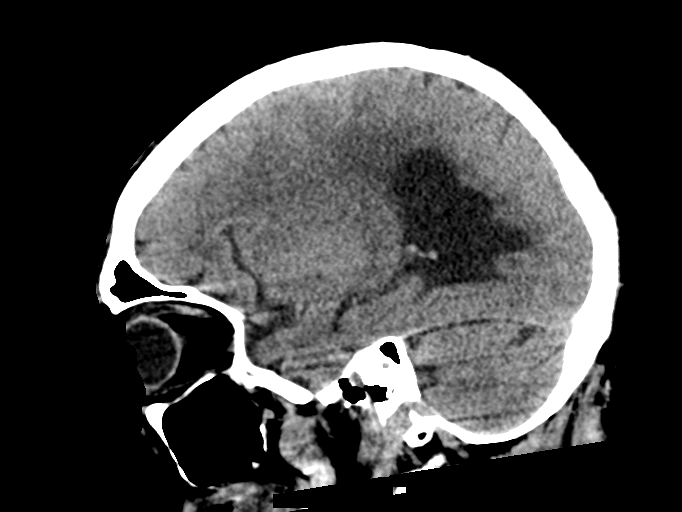
[im 32/64  brain]
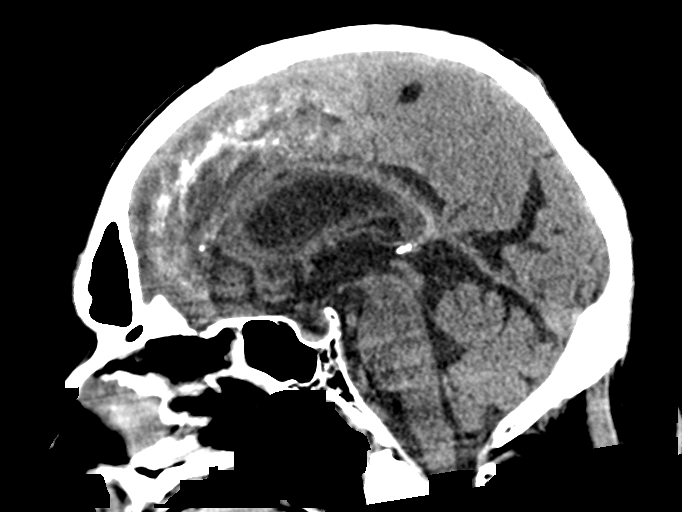
[im 43/64  brain]
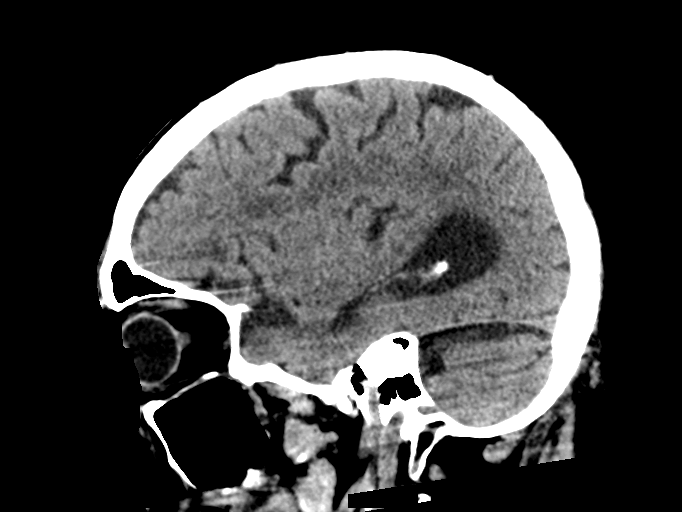

[15 of 47 positions shown; findings below may reference images not displayed]

FINDINGS: Brain: Normal anatomic configuration. Moderate parenchymal volume
loss is slightly progressive since prior examination. Moderate
periventricular white matter changes are present likely reflecting
the sequela of small vessel ischemia. Remote lacunar infarct within
the right corona radiata is stable. No abnormal intra or extra-axial
mass lesion or fluid collection. No abnormal mass effect or midline
shift. No evidence of acute intracranial hemorrhage or infarct.
Ventricular size is normal. Cerebellum unremarkable.

Vascular: No asymmetric hyperdense vasculature at the skull base.

Skull: Intact

Sinuses/Orbits: Paranasal sinuses are clear. Orbits are
unremarkable.

Other: Mastoid air cells and middle ear cavities are clear.
IMPRESSION: No acute intracranial hemorrhage or infarct.

Moderate senescent change, slightly progressive since prior
examination. Stable remote lacunar infarct.

## 2022-10-18 NOTE — Progress Notes (Unsigned)
BH MD/PA/NP OP Progress Note  10/20/2022 3:34 PM Calvin Bates  MRN:  [id number]  Chief Complaint:  Chief Complaint  Patient presents with   Follow-up   HPI:  This is a follow-up appointment for schizophrenia and tardive dyskinesia.  When he is asked about how he is doing, he states that "they have no hair." When he is asked to elaborate it, he laughs, and then states that he is doing well.  He enjoys going outside at times. When he is asked if anything he wishes to be different, he answers that those "coming out." "Coming out from whatever" and laughs.  He denies irritability.  He is doing well at family home.  He denies feeling depressed or anxiety.  He denies SI.  He denies hallucinations or paranoia.  He is not bothered by the lip smacking, and declined to try the medication.   According to the caregiver at the group home, she states that he has been doing well.  He takes medication regularly.  There is no behavior issues or any concern.   Household: (family home) Marital status:divorced, married three times Number of children:2 (estranged relationship) Employment: unemployed, on disability Education: 9th grade, (He left school having issues with some group of people who causes trouble). obtained GED   Last PCP / ongoing medical evaluation  Visit Diagnosis:    ICD-10-CM   1. Schizophrenia, unspecified type  F20.9     2. Tardive dyskinesia  G24.01       Past Psychiatric History: Please see initial evaluation for full details. I have reviewed the history. No updates at this time.     Past Medical History:  Past Medical History:  Diagnosis Date   Diabetes mellitus without complication    Diabetic retinopathy    Hyperlipemia    Hypertension    Neuropathy    Psychosis    Stroke     Past Surgical History:  Procedure Laterality Date   BACK SURGERY      Family Psychiatric History: Please see initial evaluation for full details. I have reviewed the history. No updates at  this time.     Family History: History reviewed. No pertinent family history.  Social History:  Social History   Socioeconomic History   Marital status: Divorced    Spouse name: Not on file   Number of children: 2   Years of education: Not on file   Highest education level: GED or equivalent  Occupational History   Not on file  Tobacco Use   Smoking status: Never   Smokeless tobacco: Never  Substance and Sexual Activity   Alcohol use: No    Alcohol/week: 0.0 standard drinks of alcohol   Drug use: No   Sexual activity: Not Currently  Other Topics Concern   Not on file  Social History Narrative   Not on file   Social Determinants of Health   Financial Resource Strain: Not on file  Food Insecurity: Not on file  Transportation Needs: Not on file  Physical Activity: Not on file  Stress: Not on file  Social Connections: Not on file    Allergies:  Allergies  Allergen Reactions   Seasonal Ic [Octacosanol]     Metabolic Disorder Labs: No results found for: "HGBA1C", "MPG" No results found for: "PROLACTIN" No results found for: "CHOL", "TRIG", "HDL", "CHOLHDL", "VLDL", "LDLCALC" No results found for: "TSH"  Therapeutic Level Labs: No results found for: "LITHIUM" No results found for: "VALPROATE" No results found for: "CBMZ"  Current Medications: Current Outpatient Medications  Medication Sig Dispense Refill   acetaminophen (TYLENOL) 325 MG tablet Take 650 mg by mouth every 6 (six) hours as needed.     albuterol (PROVENTIL HFA;VENTOLIN HFA) 108 (90 Base) MCG/ACT inhaler Inhale 2 puffs into the lungs every 6 (six) hours as needed for wheezing or shortness of breath. 1 Inhaler 0   Alpha-Lipoic Acid 600 MG CAPS Take 600 capsules by mouth once.     amLODipine (NORVASC) 10 MG tablet Take 10 mg by mouth daily.     Ascorbic Acid (VITAMIN C) 1000 MG tablet Take 1,000 mg by mouth daily.     aspirin 81 MG EC tablet Take 81 mg by mouth daily.     atorvastatin (LIPITOR) 40  MG tablet Take 40 mg by mouth daily.     cholecalciferol (VITAMIN D3) 25 MCG (1000 UNIT) tablet Take 1,000 Units by mouth daily.     co-enzyme Q-10 30 MG capsule Take by mouth daily.     fluticasone (FLONASE) 50 MCG/ACT nasal spray Place into both nostrils at bedtime.     guaifenesin (ROBITUSSIN) 100 MG/5ML syrup Take 200 mg by mouth 3 (three) times daily as needed for cough.     lisinopril (PRINIVIL,ZESTRIL) 40 MG tablet Take 40 mg by mouth daily.     loperamide (IMODIUM) 2 MG capsule Take by mouth as needed for diarrhea or loose stools.     magnesium hydroxide (MILK OF MAGNESIA) 400 MG/5ML suspension Take by mouth daily as needed for mild constipation.     magnesium oxide (MAG-OX) 400 MG tablet Take 1 tablet by mouth 2 (two) times daily.     metFORMIN (GLUCOPHAGE) 500 MG tablet Take 500 mg by mouth 2 (two) times daily with a meal.     metoprolol tartrate (LOPRESSOR) 25 MG tablet Take 25 mg by mouth 2 (two) times daily.     Multiple Vitamin (MULTIVITAMIN) capsule Take 1 capsule by mouth daily.     Omega-3 Fatty Acids (FISH OIL) 1000 MG CAPS Take 1 capsule by mouth 2 (two) times daily.     tamsulosin (FLOMAX) 0.4 MG CAPS capsule Take 0.4 mg by mouth daily.     thiamine (VITAMIN B-1) 100 MG tablet Take 100 mg by mouth daily.     thiamine 100 MG tablet Take 100 mg by mouth daily.     OLANZapine (ZYPREXA) 2.5 MG tablet Take 1 tablet (2.5 mg total) by mouth at bedtime for 15 days. 15 tablet 0   [START ON 10/29/2022] OLANZapine (ZYPREXA) 2.5 MG tablet Take 1 tablet (2.5 mg total) by mouth at bedtime. 30 tablet 3   No current facility-administered medications for this visit.     Musculoskeletal: Strength & Muscle Tone: within normal limits Gait & Station: normal Patient leans: N/A  Psychiatric Specialty Exam: Review of Systems  Psychiatric/Behavioral: Negative.    All other systems reviewed and are negative.   Blood pressure 130/61, pulse 63, temperature (!) 97.5 F (36.4 C), temperature  source Skin, height 5' 8.25" (1.734 m), weight 163 lb 3.2 oz (74 kg).Body mass index is 24.63 kg/m.  General Appearance: Fairly Groomed  Eye Contact:  Good  Speech:  Clear and Coherent  Volume:  Normal  Mood:   good  Affect:  Appropriate, Congruent, and Full Range  Thought Process:  Coherent, disorganized at times  Orientation:  Full (Time, Place, and Person)  Thought Content: Logical   Suicidal Thoughts:  No  Homicidal Thoughts:  No  Memory:  Immediate;  Good  Judgement:  Good  Insight:  Fair  Psychomotor Activity:   lip smacking  Normal tone, no rigidity, no resting/postural tremors, no tardive dyskinesia    Concentration:  Concentration: Good and Attention Span: Good  Recall:  Good  Fund of Knowledge: Good  Language: Good  Akathisia:  No  Handed:  Right  AIMS (if indicated): 3  Assets:  Communication Skills Desire for Improvement  ADL's:  Intact  Cognition: WNL  Sleep:  Good   Screenings: GAD-7    Flowsheet Row Office Visit from 07/28/2022 in [facility]  Total GAD-7 Score 0      PHQ2-9    [place] Office Visit from 07/28/2022 in [place] Office Visit from 05/16/2022 in [facility]  PHQ-2 Total Score 0 0      [place] Office Visit from 07/28/2022 in [place] ED from 01/30/2021 in [facility] Emergency Department at [place] No Risk No Risk        Assessment and Plan:  Calvin Bates is a 72 y.o. year old male with a history of psychosis,  tardive dyskinesia, CVA, diabetes, hyperlipidemia, hypertension, who presents for follow up appointment for below.   1. Schizophrenia, unspecified type Acute stressors include:  Other stressors include:    History:according to the caregiver at family home, he has been stable for ten years on olanzapine. Dose has been  tapered down. Had paranoia when he missed to take it for 2 days in the past  Although he has occasional disorganized thought process, he is calm, and staff denies any concern since the last visit.  Will continue current dose of olanzapine to target schizophrenia.   2. Tardive dyskinesia He continues to have lip smacking, which has been going on at least for the past year.  Although he was advised again to try VMAT 2 inhibitor, he is not interested in this. Discontinuation of olanzapine will not be a choice given his reported worsening in his symptoms as described above.  He verbalized understanding that the medication will be offered if any worsening in the future.   Plan Continue olanzapine 2.5 mg at night  Next appointment: 8/1 at 4 pm for 30 mins, IP He has an appointment with PCP on 4/25.  The staff was advised again to send about this result for review (HbA1c, lipid)   The patient demonstrates the following risk factors for suicide: Chronic risk factors for suicide include: psychiatric disorder of schizophrenia . Acute risk factors for suicide include: unemployment. Protective factors for this patient include: positive social support and hope for the future. Considering these factors, the overall suicide risk at this point appears to be low. Patient is appropriate for outpatient follow up.    This clinician has discussed the side effect associated with medication prescribed during this encounter. Please refer to notes in the previous encounters for more details.    Collaboration of Care: Collaboration of Care: Other reviewed notes in Epic  Patient/Guardian was advised Release of Information must be obtained prior to any record release in order to collaborate their care with an outside provider. Patient/Guardian was advised if they have not already done so to contact the registration department to sign all necessary forms in order for [place] to release information regarding their care.   Consent:  Patient/Guardian gives verbal consent for treatment and assignment of benefits for services provided  during this visit. Patient/Guardian expressed understanding and agreed to proceed.    Norman Clay, MD 10/20/2022, 3:34 PM

## 2022-10-20 ENCOUNTER — Encounter: Payer: Self-pay | Admitting: Psychiatry

## 2022-10-20 ENCOUNTER — Ambulatory Visit (INDEPENDENT_AMBULATORY_CARE_PROVIDER_SITE_OTHER): Payer: 59 | Admitting: Psychiatry

## 2022-10-20 VITALS — BP 130/61 | HR 63 | Temp 97.5°F | Ht 68.25 in | Wt 163.2 lb

## 2022-10-20 DIAGNOSIS — G2401 Drug induced subacute dyskinesia: Secondary | ICD-10-CM

## 2022-10-20 DIAGNOSIS — F209 Schizophrenia, unspecified: Secondary | ICD-10-CM

## 2022-10-20 MED ORDER — OLANZAPINE 2.5 MG PO TABS: 2.5000 mg | ORAL_TABLET | Freq: Every day | ORAL | 3 refills | Status: DC

## 2022-11-14 LAB — LAB REPORT - SCANNED
A1c: 7.2
Albumin, Urine POC: 145.5
Albumin/Creatinine Ratio, Urine, POC: 142
Creatinine, POC: 102.8 mg/dL
EGFR (Non-African Amer.): 54

## 2022-11-21 ENCOUNTER — Telehealth: Payer: Self-pay | Admitting: Psychiatry

## 2022-11-21 NOTE — Telephone Encounter (Signed)
Labs drawn on 11/14/2022 have been reviewed. They will be scanned into the chart.  CBC- Hb 12.1, RBC 4.07, Ht 36.4, MCV 89, otherwise wnl CMP- BUN 25, Cre 1.39, Glu 172, TP 5.9, otherwise wnl Lpid- wnl HbA1c 7.2

## 2023-02-11 NOTE — Progress Notes (Signed)
BH MD/PA/NP OP Progress Note  02/16/2023 5:15 PM ARIAM Bates  MRN:  [id number]  Chief Complaint:  Chief Complaint  Patient presents with   Follow-up   HPI:  This is a follow-up appointment for schizophrenia, tardive dyskinesia.  He states that there is "misunderstand." He talks about SW coming up to his place, and asks the staff to elaborate. After staff answered to him that she is not aware of anything, he states that "it is fine then (and smiles.)"  He enjoys going outside.  He enjoys reading.  He hopes to live on his own.  When he was asked about the reason, he talks about the books he reads.  He does not have any plan to live on his own, and reports fair relationship with other residents at the family home.  He agrees that he discontinued aspirin as he did not want to.  Although he is aware of lip smacking, it is not bothering, and he is not interested in pharmacological treatment.  He sleeps well.  He has good appetite.  He denies hallucinations, paranoia. He denies alcohol use, drug use.   Wt Readings from Last 3 Encounters:  02/16/23 171 lb 6.4 oz (77.7 kg)  10/20/22 163 lb 3.2 oz (74 kg)  07/28/22 179 lb (81.2 kg)      Household: (family home) Marital status:divorced, married three times Number of children:2 (estranged relationship) Employment: unemployed, on disability Education: 9th grade, (He left school having issues with some group of people who causes trouble). obtained GED   Last PCP / ongoing medical evaluation  Visit Diagnosis:    ICD-10-CM   1. Schizophrenia, unspecified type (HCC)  F20.9     2. Tardive dyskinesia  G24.01       Past Psychiatric History: Please see initial evaluation for full details. I have reviewed the history. No updates at this time.     Past Medical History:  Past Medical History:  Diagnosis Date   Diabetes mellitus without complication (HCC)    Diabetic retinopathy (HCC)    Hyperlipemia    Hypertension    Neuropathy    Psychosis  (HCC)    Stroke [facility])     Past Surgical History:  Procedure Laterality Date   BACK SURGERY      Family Psychiatric History: Please see initial evaluation for full details. I have reviewed the history. No updates at this time.     Family History: History reviewed. No pertinent family history.  Social History:  Social History   Socioeconomic History   Marital status: Divorced    Spouse name: Not on file   Number of children: 2   Years of education: Not on file   Highest education level: GED or equivalent  Occupational History   Not on file  Tobacco Use   Smoking status: Never   Smokeless tobacco: Never  Substance and Sexual Activity   Alcohol use: No    Alcohol/week: 0.0 standard drinks of alcohol   Drug use: No   Sexual activity: Not Currently  Other Topics Concern   Not on file  Social History Narrative   Not on file   Social Determinants of Health   Financial Resource Strain: Not on file  Food Insecurity: Not on file  Transportation Needs: Not on file  Physical Activity: Not on file  Stress: Not on file  Social Connections: Not on file    Allergies:  Allergies  Allergen Reactions   Seasonal Ic [Octacosanol]     Metabolic  Disorder Labs: No results found for: "HGBA1C", "MPG" No results found for: "PROLACTIN" No results found for: "CHOL", "TRIG", "HDL", "CHOLHDL", "VLDL", "LDLCALC" No results found for: "TSH"  Therapeutic Level Labs: No results found for: "LITHIUM" No results found for: "VALPROATE" No results found for: "CBMZ"  Current Medications: Current Outpatient Medications  Medication Sig Dispense Refill   acetaminophen (TYLENOL) 325 MG tablet Take 650 mg by mouth every 6 (six) hours as needed.     albuterol (PROVENTIL HFA;VENTOLIN HFA) 108 (90 Base) MCG/ACT inhaler Inhale 2 puffs into the lungs every 6 (six) hours as needed for wheezing or shortness of breath. 1 Inhaler 0   amLODipine (NORVASC) 10 MG tablet Take 10 mg by mouth daily.      Ascorbic Acid (VITAMIN C) 1000 MG tablet Take 250 mg by mouth daily.     atorvastatin (LIPITOR) 40 MG tablet Take 20 mg by mouth daily.     cholecalciferol (VITAMIN D3) 25 MCG (1000 UNIT) tablet Take 1,000 Units by mouth daily.     co-enzyme Q-10 30 MG capsule Take by mouth daily.     fluticasone (FLONASE) 50 MCG/ACT nasal spray Place into both nostrils at bedtime.     guaifenesin (ROBITUSSIN) 100 MG/5ML syrup Take 200 mg by mouth 3 (three) times daily as needed for cough.     lisinopril (PRINIVIL,ZESTRIL) 40 MG tablet Take 40 mg by mouth daily.     loperamide (IMODIUM) 2 MG capsule Take by mouth as needed for diarrhea or loose stools.     magnesium hydroxide (MILK OF MAGNESIA) 400 MG/5ML suspension Take by mouth daily as needed for mild constipation.     magnesium oxide (MAG-OX) 400 MG tablet Take 1 tablet by mouth 2 (two) times daily.     metFORMIN (GLUCOPHAGE) 500 MG tablet Take 500 mg by mouth 2 (two) times daily with a meal.     metoprolol tartrate (LOPRESSOR) 25 MG tablet Take 25 mg by mouth 2 (two) times daily.     Omega-3 Fatty Acids (FISH OIL) 1000 MG CAPS Take 1 capsule by mouth 2 (two) times daily.     tamsulosin (FLOMAX) 0.4 MG CAPS capsule Take 0.4 mg by mouth daily.     thiamine (VITAMIN B-1) 100 MG tablet Take 100 mg by mouth daily.     thiamine 100 MG tablet Take 100 mg by mouth daily.     Multiple Vitamin (MULTIVITAMIN) capsule Take 1 capsule by mouth daily. (Patient not taking: Reported on 02/16/2023)     OLANZapine (ZYPREXA) 2.5 MG tablet Take 1 tablet (2.5 mg total) by mouth at bedtime for 15 days. 15 tablet 0   [START ON 02/26/2023] OLANZapine (ZYPREXA) 2.5 MG tablet Take 1 tablet (2.5 mg total) by mouth at bedtime. 150 tablet 0   No current facility-administered medications for this visit.     Musculoskeletal: Strength & Muscle Tone: within normal limits Gait & Station:  uses a walker, s/p CVA Patient leans: N/A  Psychiatric Specialty Exam: Review of Systems   Psychiatric/Behavioral:  Negative for agitation, behavioral problems, confusion, decreased concentration, dysphoric mood, hallucinations, self-injury, sleep disturbance and suicidal ideas. The patient is not nervous/anxious and is not hyperactive.   All other systems reviewed and are negative.   Blood pressure (!) 172/80, pulse 60, temperature 98.2 F (36.8 C), temperature source Skin, height 5' 8.25" (1.734 m), weight 171 lb 6.4 oz (77.7 kg).Body mass index is 25.87 kg/m.  General Appearance: Fairly Groomed  Eye Contact:  Good  Speech:  Clear  and Coherent  Volume:  Normal  Mood:   good  Affect:  Appropriate, Congruent, and calm  Thought Process:  Coherent, irrelevant at times  Orientation:  Full (Time, Place, and Person)  Thought Content: Logical   Suicidal Thoughts:  No  Homicidal Thoughts:  No  Memory:  Immediate;   Good  Judgement:  Good  Insight:  Present  Psychomotor Activity:  TD, no rigidity, no tremors  Concentration:  Concentration: Good and Attention Span: Good  Recall:  Good  Fund of Knowledge: Good  Language: Good  Akathisia:  No  Handed:  Right  AIMS (if indicated): 2- lip smacking  Assets:  Communication Skills Desire for Improvement  ADL's:  Intact  Cognition: WNL  Sleep:  Good   Screenings: GAD-7    Flowsheet Row Office Visit from 07/28/2022 in [facility]  Total GAD-7 Score 0      PHQ2-9    Flowsheet Row Office Visit from 07/28/2022 in [facility] Office Visit from 05/16/2022 in [facility]  PHQ-2 Total Score 0 0      Flowsheet Row Office Visit from 07/28/2022 in [facility] ED from 01/30/2021 in [facility] Emergency Department at [facility]  C-SSRS RISK CATEGORY No Risk No Risk        Assessment and Plan:  Calvin Bates is a 72 y.o. year old male with a history of  psychosis,  tardive dyskinesia, CVA, diabetes, hyperlipidemia, hypertension, who presents for follow up appointment for below.   1. Schizophrenia, unspecified type (HCC) Acute stressors include:  Other stressors include:    History:according to the caregiver at family home, he has been stable for ten years on olanzapine. Dose has been tapered down. Had paranoia when he missed to take it for 2 days in the past  Although he continues to demonstrate occasional disorganized thought process, he is calm through the visit, and the staff denies any concerns since last visit, which has been consistent since the initial visit.  Will continue current dose of olanzapine to target schizophrenia.  Noted that he discontinued aspirin as he did not want to continue, although no side effect from the medication.  Discussed with the patient about the increased risk of CVA from olanzapine for people with dementia.  He was advised to be back on aspirin after discussion with PCP given his prior history of stroke.   2. Tardive dyskinesia Exam is notable for lip smacking, which has been unchanged since initial visit.  He is not interested in pharmacological treatment such as VMAT 2 inhibitor.  He will not be a good candidate to discontinue olanzapine due to relapsing his symptoms as above. Will continue to assess.     Plan Continue olanzapine 2.5 mg at night QTc 403 msec, HR 91, 01/2021 Next appointment: 12/17 at 4 pm for 30 mins, IP Send EKG result to the office Reviewed labs- HbA1c 7.2 %  Lipid wnl  10/2022,  The patient demonstrates the following risk factors for suicide: Chronic risk factors for suicide include: psychiatric disorder of schizophrenia . Acute risk factors for suicide include: unemployment. Protective factors for this patient include: positive social support and hope for the future. Considering these factors, the overall suicide risk at this point appears to be low. Patient is appropriate for outpatient  follow up.     Collaboration of Care: Collaboration of Care: Other reviewed notes in Epic  Patient/Guardian was  advised Release of Information must be obtained prior to any record release in order to collaborate their care with an outside provider. Patient/Guardian was advised if they have not already done so to contact the registration department to sign all necessary forms in order for [place] to release information regarding their care.   Consent: Patient/Guardian gives verbal consent for treatment and assignment of benefits for services provided during this visit. Patient/Guardian expressed understanding and agreed to proceed.   The duration of the time spent on the following activities on the date of the encounter was 30 minutes.   Preparing to see the patient (e.g., review of test, records)  Obtaining and/or reviewing separately obtained history  Performing a medically necessary exam and/or evaluation  Counseling and educating the patient/family/caregiver  Ordering medications, tests, or procedures  Referring and communicating with other healthcare professionals (when not reported separately)  Documenting clinical information in the electronic or paper health record  Independently interpreting results of tests/labs and communication of results to the family or caregiver  Care coordination (when not reported separately)   Neysa Hotter, MD 02/16/2023, 5:15 PM

## 2023-02-16 ENCOUNTER — Encounter: Payer: Self-pay | Admitting: Psychiatry

## 2023-02-16 ENCOUNTER — Ambulatory Visit (INDEPENDENT_AMBULATORY_CARE_PROVIDER_SITE_OTHER): Payer: 59 | Admitting: Psychiatry

## 2023-02-16 VITALS — BP 172/80 | HR 60 | Temp 98.2°F | Ht 68.25 in | Wt 171.4 lb

## 2023-02-16 DIAGNOSIS — G2401 Drug induced subacute dyskinesia: Secondary | ICD-10-CM

## 2023-02-16 DIAGNOSIS — F209 Schizophrenia, unspecified: Secondary | ICD-10-CM | POA: Diagnosis not present

## 2023-02-16 MED ORDER — OLANZAPINE 2.5 MG PO TABS: 2.5000 mg | ORAL_TABLET | Freq: Every day | ORAL | 0 refills | Status: DC

## 2023-03-02 ENCOUNTER — Telehealth: Payer: Self-pay | Admitting: Psychiatry

## 2023-03-02 NOTE — Telephone Encounter (Signed)
Reviewed EKG. NSR. HR 60, QTc 379 msec 02/2023

## 2023-06-23 ENCOUNTER — Ambulatory Visit: Payer: 59 | Admitting: Internal Medicine

## 2023-06-24 ENCOUNTER — Emergency Department
Admission: EM | Admit: 2023-06-24 | Discharge: 2023-06-24 | Disposition: A | Discharge status: Home / Self Care | Payer: 59 | Attending: Emergency Medicine | Admitting: Emergency Medicine

## 2023-06-24 ENCOUNTER — Emergency Department: Payer: 59

## 2023-06-24 ENCOUNTER — Other Ambulatory Visit: Payer: Self-pay

## 2023-06-24 DIAGNOSIS — W01198A Fall on same level from slipping, tripping and stumbling with subsequent striking against other object, initial encounter: Secondary | ICD-10-CM | POA: Diagnosis not present

## 2023-06-24 DIAGNOSIS — I6782 Cerebral ischemia: Secondary | ICD-10-CM | POA: Diagnosis not present

## 2023-06-24 DIAGNOSIS — R9082 White matter disease, unspecified: Secondary | ICD-10-CM | POA: Diagnosis not present

## 2023-06-24 DIAGNOSIS — E860 Dehydration: Secondary | ICD-10-CM | POA: Insufficient documentation

## 2023-06-24 LAB — CBC
HCT: 36 % — ABNORMAL LOW (ref 39.0–52.0)
Hemoglobin: 12.1 g/dL — ABNORMAL LOW (ref 13.0–17.0)
MCH: 30.3 pg (ref 26.0–34.0)
MCHC: 33.6 g/dL (ref 30.0–36.0)
MCV: 90 fL (ref 80.0–100.0)
Platelets: 196 10*3/uL (ref 150–400)
RBC: 4 MIL/uL — ABNORMAL LOW (ref 4.22–5.81)
RDW: 12.7 % (ref 11.5–15.5)
WBC: 8.3 10*3/uL (ref 4.0–10.5)
nRBC: 0 % (ref 0.0–0.2)

## 2023-06-24 LAB — BASIC METABOLIC PANEL
Anion gap: 10 (ref 5–15)
BUN: 30 mg/dL — ABNORMAL HIGH (ref 8–23)
CO2: 22 mmol/L (ref 22–32)
Calcium: 9.5 mg/dL (ref 8.9–10.3)
Chloride: 105 mmol/L (ref 98–111)
Creatinine, Ser: 1.52 mg/dL — ABNORMAL HIGH (ref 0.61–1.24)
GFR, Estimated: 48 mL/min — ABNORMAL LOW (ref >60)
Glucose, Bld: 149 mg/dL — ABNORMAL HIGH (ref 70–99)
Potassium: 4.1 mmol/L (ref 3.5–5.1)
Sodium: 137 mmol/L (ref 135–145)

## 2023-06-24 LAB — URINALYSIS, ROUTINE W REFLEX MICROSCOPIC
Bacteria, UA: NONE SEEN
Bilirubin Urine: NEGATIVE
Glucose, UA: NEGATIVE mg/dL
Hgb urine dipstick: NEGATIVE
Ketones, ur: NEGATIVE mg/dL
Leukocytes,Ua: NEGATIVE
Nitrite: NEGATIVE
Protein, ur: 30 mg/dL — AB
RBC / HPF: 0 RBC/hpf (ref 0–5)
Specific Gravity, Urine: 1.009 (ref 1.005–1.030)
Squamous Epithelial / HPF: 0 /[HPF] (ref 0–5)
pH: 8 (ref 5.0–8.0)

## 2023-06-24 MED ORDER — SODIUM CHLORIDE 0.9 % IV BOLUS
1000.0000 mL | Freq: Once | INTRAVENOUS | Status: AC
  Administered 2023-06-24: 1000 mL via INTRAVENOUS

## 2023-06-24 NOTE — ED Notes (Addendum)
While helping patient change out of soiled clothes learned that his mattress at facility is being treated for bed bugs and that he has recent bites on his neck. No bed bugs found in room. Out of abundance of caution, pt is completely undressed with bed linens changed and placed in bags along with clothes. Pt bathed and dressed in fresh gown.

## 2023-06-24 NOTE — ED Notes (Signed)
Called [facility] and they stated the house administrated was not there at this time and that is who I could give report to and they would give me a call back. Informed charge nurse of this information.

## 2023-06-24 NOTE — Discharge Instructions (Addendum)
Please drink plenty of water prevent dehydration to decrease the riskof falling.  Make an appointment with your PCP as needed.Resume your medications at the facility.

## 2023-06-24 NOTE — ED Notes (Signed)
Calvin Bates from care facility called this RN back and stated that the niece of the patient was willing to come and pick patient up. Informed charge nurse of this info.

## 2023-06-24 NOTE — ED Notes (Signed)
House administrator, Bruna Potter called this RN and was given report about patient. She also informed this RN the niece was on her way to come and pick patient up.

## 2023-06-24 NOTE — ED Triage Notes (Signed)
Pt in via EMS from [facility] group home with c/o fall. EMS reports per facility, pt fell today landing on his right elbow. Pt has fallen multiple times over the past weekm and a half. Pt also fell yesterday and hit his right side of his head on a walker. Pt fell into a chair last Sunday. Pt has not been seen for any of those falls until today. No blood thinners. 166/76, 97.8 temp, CBG 176, 98% RA, HR 71. Pt is unsteady on his feet. Right leg is swollen compared to the other one, per pt this is normal for him

## 2023-06-24 NOTE — ED Triage Notes (Signed)
Pt to ED for multiple falls. Reports he just couldn't walk that far. Skin tear to right elbow. Reports pain to right rib cage.

## 2023-06-24 NOTE — ED Provider Notes (Signed)
[facility] Provider Note    Event Date/Time   First MD Initiated Contact with Patient 06/24/23 1257     (approximate)   History   Fall   HPI  Calvin Bates is a 72 y.o. male brought by EMS from [facility] with c/o fall. Patient reports he felt while he was going to the bathroom and landed on his right elbow.  Patient complains of having these episodes more frequently daily. Patient reported  he fell  on his right side of his head yesterday and fell last Sunday on a chair at [facility].  Patient reports  not drinking any water, he denies any other symptoms       Physical Exam   Triage Vital Signs: ED Triage Vitals [06/24/23 1051]  Encounter Vitals Group     BP (!) 148/67     Systolic BP Percentile      Diastolic BP Percentile      Pulse Rate 72     Resp 18     Temp 98.2 F (36.8 C)     Temp src      SpO2 95 %     Weight 171 lb (77.6 kg)     Height 5\' 9"  (1.753 m)     Head Circumference      Peak Flow      Pain Score 5     Pain Loc      Pain Education      Exclude from Growth Chart     Most recent vital signs: Vitals:   06/24/23 1051  BP: (!) 148/67  Pulse: 72  Resp: 18  Temp: 98.2 F (36.8 C)  SpO2: 95%     General: Awake, no distress.  Alert x4 Head:              No signs of hematoma, PERLA CV:  Good peripheral perfusion.  No murmurs, no S3-S4. Resp:  Normal effort.  No rales no crackles no wheezing Abd:  No distention.  Bowel sounds positive no signs of peritoneal irritation Back: Skin is  intact.  No pain to palpation.  Extremities: Right elbow : small laceration.  No hematoma.  Full flexion and extension.  Strength 5/5, sensation intact. Lower extremities: Right lower leg pitting edema   ED Results / Procedures / Treatments   Labs (all labs ordered are listed, but only abnormal results are displayed) Labs Reviewed  CBC - Abnormal; Notable for the following components:      Result Value   RBC 4.00 (*)     Hemoglobin 12.1 (*)    HCT 36.0 (*)    All other components within normal limits  BASIC METABOLIC PANEL - Abnormal; Notable for the following components:   Glucose, Bld 149 (*)    BUN 30 (*)    Creatinine, Ser 1.52 (*)    GFR, Estimated 48 (*)    All other components within normal limits  URINALYSIS, ROUTINE W REFLEX MICROSCOPIC - Abnormal; Notable for the following components:   Color, Urine STRAW (*)    APPearance CLEAR (*)    Protein, ur 30 (*)    All other components within normal limits     EKG Sinus rhythm     RADIOLOGY I independently reviewed and interpreted imaging and agree with radiologists findings.  Radiology reports on chest x-ray no signs of acute fracture, CT head without contrast reports no acute intracranial trauma or  acute traumatic injury.    PROCEDURES:  Critical Care performed:   Procedures   MEDICATIONS ORDERED IN ED: Medications  sodium chloride 0.9 % bolus 1,000 mL (1,000 mLs Intravenous New Bag/Given 06/24/23 1422)     IMPRESSION / MDM / ASSESSMENT AND PLAN / ED COURSE  I reviewed the triage vital signs and the nursing notes.  Differential diagnosis includes, but is not limited to, dehydration, hyponatremia, hypoglycemia, anemia,aortic stenosis, A-fib,UTI or medication side effects,  Patient's presentation is most consistent with acute complicated illness / injury requiring diagnostic workup. Patient with dehydration with electrolytes within normal range, ruling out hyponatremia.  Physical exam patient presents dry oral mucosa.  No murmurs ruling out aortic stenosis, normal rhythm ruling out A-fib and confirmed with EKG results. CBC reports hemoglobin 12.1 (mild anemia) BUN and creatinine elevated without changes compared to the anterior labs.  Glucose is 149 ruling out hypoglycemia. UTI is ruled out with a negative urinalysis and normal white blood cells count. Patient received saline solution 1000 mL NS bolus to treat his  dehydration.   Clinical Course as of 06/24/23 1433  Sat Jun 24, 2023  1354 CT Head Wo Contrast [AE]    Clinical Course User Index [AE] Gladys Damme, PA-C   Discussed plan of care with patient, answered all of patient's questions, Patient agreeable to plan of care. Patient to follow  up with PCP as needed. Advised patient to take medications according to the instructions on the label.  Patient verbalized understanding.   FINAL CLINICAL IMPRESSION(S) / ED DIAGNOSES   Final diagnoses:  Dehydration determined by examination     Rx / DC Orders   ED Discharge Orders     None        Note:  This document was prepared using Dragon voice recognition software and may include unintentional dictation errors.   Gladys Damme, PA-C 06/24/23 1532    Janith Lima, MD 06/25/23 0700

## 2023-07-01 NOTE — Progress Notes (Unsigned)
BH MD/PA/NP OP Progress Note  07/04/2023 2:35 PM Calvin Bates  MRN:  [id number]  Chief Complaint:  Chief Complaint  Patient presents with   Follow-up   HPI:  According to the chart review, the following events have occurred since the last visit: The patient was seen at ED after fall. Head CT was taken.  - "Brain 06/2023: Chronically advanced cerebral volume loss is stable. No midline shift, ventriculomegaly, mass effect, evidence of mass lesion, intracranial hemorrhage or evidence of cortically based acute infarction. Patchy and confluent bilateral white matter hypodensity is stable."   This is a follow-up appointment for schizophrenia and her leg dyskinesia.  He states that he has been doing fine.  He had a good Thanksgiving at his niece, and was able to have family gathering.  He meets with his niece once a week.  He spends time watching television otherwise.  He denies feeling depressed or anxiety.  He denies hallucinations, paranoia.  He thinks he has been stable.  He denies any fall since the ED visit.  He denies any dizziness.  He sleeps several hours. No change in appetite.   The caregiver presents to the visit with him.  She states that things has been going well except that he had a fall.  He was falling every other day until he got physical therapy, new walker, and treatment for dehydration.  He was dragging right foot prior to this.  He was taking some oregano, and pepper when he was having cold, although he does not take them any more. He does not take any other herbs or OTC medication. She does not think there has been any change in lipsmacking. He will have new PCP visit. She agrees to check his body weight.   Household: (family home) Marital status:divorced, married three times Number of children:2 (estranged relationship) Employment: unemployed, on disability Education: 9th grade, (He left school having issues with some group of people who causes trouble). obtained GED    Last PCP / ongoing medical evaluation   Wt Readings from Last 3 Encounters:  07/04/23 176 lb 3.2 oz (79.9 kg)  06/24/23 171 lb (77.6 kg)  02/16/23 171 lb 6.4 oz (77.7 kg)     Visit Diagnosis:    ICD-10-CM   1. Schizophrenia, unspecified type (HCC)  F20.9     2. Tardive dyskinesia  G24.01       Past Psychiatric History: Please see initial evaluation for full details. I have reviewed the history. No updates at this time.     Past Medical History:  Past Medical History:  Diagnosis Date   Diabetes mellitus without complication (HCC)    Diabetic retinopathy (HCC)    Hyperlipemia    Hypertension    Neuropathy    Psychosis (HCC)    Stroke [facility])     Past Surgical History:  Procedure Laterality Date   BACK SURGERY      Family Psychiatric History: Please see initial evaluation for full details. I have reviewed the history. No updates at this time.     Family History: History reviewed. No pertinent family history.  Social History:  Social History   Socioeconomic History   Marital status: Divorced    Spouse name: Not on file   Number of children: 2   Years of education: Not on file   Highest education level: GED or equivalent  Occupational History   Not on file  Tobacco Use   Smoking status: Never   Smokeless tobacco: Never  Substance  and Sexual Activity   Alcohol use: No    Alcohol/week: 0.0 standard drinks of alcohol   Drug use: No   Sexual activity: Not Currently  Other Topics Concern   Not on file  Social History Narrative   Not on file   Social Drivers of Health   Financial Resource Strain: Not on file  Food Insecurity: Not on file  Transportation Needs: Not on file  Physical Activity: Not on file  Stress: Not on file  Social Connections: Not on file    Allergies:  Allergies  Allergen Reactions   Seasonal Ic [Octacosanol]     Metabolic Disorder Labs: No results found for: "HGBA1C", "MPG" No results found for: "PROLACTIN" No results found  for: "CHOL", "TRIG", "HDL", "CHOLHDL", "VLDL", "LDLCALC" No results found for: "TSH"  Therapeutic Level Labs: No results found for: "LITHIUM" No results found for: "VALPROATE" No results found for: "CBMZ"  Current Medications: Current Outpatient Medications  Medication Sig Dispense Refill   acetaminophen (TYLENOL) 325 MG tablet Take 650 mg by mouth every 6 (six) hours as needed.     albuterol (PROVENTIL HFA;VENTOLIN HFA) 108 (90 Base) MCG/ACT inhaler Inhale 2 puffs into the lungs every 6 (six) hours as needed for wheezing or shortness of breath. 1 Inhaler 0   amLODipine (NORVASC) 10 MG tablet Take 10 mg by mouth daily.     Ascorbic Acid (VITAMIN C) 1000 MG tablet Take 250 mg by mouth daily.     atorvastatin (LIPITOR) 40 MG tablet Take 20 mg by mouth daily.     cholecalciferol (VITAMIN D3) 25 MCG (1000 UNIT) tablet Take 1,000 Units by mouth daily.     co-enzyme Q-10 30 MG capsule Take by mouth daily.     fluticasone (FLONASE) 50 MCG/ACT nasal spray Place into both nostrils at bedtime.     guaifenesin (ROBITUSSIN) 100 MG/5ML syrup Take 200 mg by mouth 3 (three) times daily as needed for cough.     lisinopril (PRINIVIL,ZESTRIL) 40 MG tablet Take 40 mg by mouth daily.     loperamide (IMODIUM) 2 MG capsule Take by mouth as needed for diarrhea or loose stools.     magnesium hydroxide (MILK OF MAGNESIA) 400 MG/5ML suspension Take by mouth daily as needed for mild constipation.     magnesium oxide (MAG-OX) 400 MG tablet Take 1 tablet by mouth 2 (two) times daily.     metFORMIN (GLUCOPHAGE) 500 MG tablet Take 500 mg by mouth 2 (two) times daily with a meal.     metoprolol tartrate (LOPRESSOR) 25 MG tablet Take 25 mg by mouth 2 (two) times daily.     Multiple Vitamin (MULTIVITAMIN) capsule Take 1 capsule by mouth daily.     Multiple Vitamins-Minerals (CENTRUM SILVER) tablet Take 1 tablet by mouth daily.     Omega-3 Fatty Acids (FISH OIL) 1000 MG CAPS Take 1 capsule by mouth 2 (two) times daily.      tamsulosin (FLOMAX) 0.4 MG CAPS capsule Take 0.4 mg by mouth daily.     thiamine (VITAMIN B-1) 100 MG tablet Take 100 mg by mouth daily.     thiamine 100 MG tablet Take 100 mg by mouth daily.     Wheat Dextrin (BENEFIBER) POWD Take 3 g by mouth in the morning, at noon, and at bedtime.     [START ON 07/26/2023] OLANZapine (ZYPREXA) 2.5 MG tablet Take 1 tablet (2.5 mg total) by mouth at bedtime. 150 tablet 0   No current facility-administered medications for this visit.  Musculoskeletal: Strength & Muscle Tone: within normal limits Gait & Station:  uses a walker Patient leans: N/A  Psychiatric Specialty Exam: Review of Systems  Psychiatric/Behavioral: Negative.    All other systems reviewed and are negative.   Blood pressure (!) 157/75, pulse 65, temperature (!) 97.3 F (36.3 C), temperature source Skin, height 5\' 9"  (1.753 m), weight 176 lb 3.2 oz (79.9 kg).Body mass index is 26.02 kg/m.  General Appearance: Well Groomed  Eye Contact:  Good  Speech:  Clear and Coherent  Volume:  Normal  Mood:   good  Affect:  Appropriate, Congruent, and Full Range  Thought Process:  Coherent  Orientation:  Full (Time, Place, and Person)  Thought Content: Logical   Suicidal Thoughts:  No  Homicidal Thoughts:  No  Memory:  Immediate;   Good  Judgement:  Good  Insight:  Good  Psychomotor Activity:  Normal, Normal tone, no rigidity, no resting/postural tremors, no tardive dyskinesia    Concentration:  Concentration: Good and Attention Span: Good  Recall:  Good  Fund of Knowledge: Good  Language: Good  Akathisia:  No  Handed:  Right  AIMS (if indicated): 0   Assets:  Communication Skills Desire for Improvement  ADL's:  Intact  Cognition: WNL  Sleep:  Good   Screenings: GAD-7    Flowsheet Row Office Visit from 07/28/2022 in [facility]  Total GAD-7 Score 0      PHQ2-9    Flowsheet Row Office Visit from 07/28/2022 in [facility] Office Visit from 05/16/2022 in [facility]  PHQ-2 Total Score 0 0      Flowsheet Row ED from 06/24/2023 in [facility] at [facility] Visit from 07/28/2022 in [facility] ED from 01/30/2021 in [facility] at [facility]  C-SSRS RISK CATEGORY No Risk No Risk No Risk        Assessment and Plan:  Calvin Bates is a 72 y.o. year old male with a history of psychosis,  tardive dyskinesia, CVA, diabetes, hyperlipidemia, hypertension, who presents for follow up appointment for below.   1. Schizophrenia, unspecified type (HCC) Acute stressors include:  Other stressors include:    History:according to the caregiver at family home, he has been stable for ten years on olanzapine. Dose has been tapered down. Had paranoia when he missed to take it for 2 days in the past  He demonstrates a linear thought process, and he is calm during the visit today.  The staff denies any concern, which has been consistent with his initial visit.  Will continue current dose of olanzapine to target schizophrenia. Discussed with the patient about the increased risk of CVA from olanzapine for people with dementia.  He was advised to be back on aspirin after discussion with PCP given his prior history of stroke.   2. Tardive dyskinesia Although he did not have noticeable lip smacking on today's evaluation, both the patient and the caregiver notices it.  He is not interested in VMAT 2 inhibitor, and denies any concern about this. He will not be a good candidate to discontinue olanzapine due to relapse in his symptoms as above.  Will continue to assess.    Plan Continue olanzapine 2.5 mg at night - EKG. NSR. HR 60, QTc 379 msec 02/2023 Next appointment: 4/29 at 3:30, IP  Reviewed labs- HbA1c 7.2 %  [facility]id [facility]nl  [facility]   The patient  demonstrates the following risk factors for suicide: Chronic risk factors for suicide include: psychiatric disorder of schizophrenia . Acute risk factors for suicide include: unemployment. Protective factors for this patient include: positive social support and hope for the future. Considering these factors, the overall suicide risk at this point appears to be low. Patient is appropriate for outpatient follow up.    Collaboration of Care: Collaboration of Care: Other reviewed notes in Epic  Patient/Guardian was advised Release of Information must be obtained prior to any record release in order to collaborate their care with an outside provider. Patient/Guardian was advised if they have not already done so to contact the registration department to sign all necessary forms in order for [place] to release information regarding their care.   Consent: Patient/Guardian gives verbal consent for treatment and assignment of benefits for services provided during this visit. Patient/Guardian expressed understanding and agreed to proceed.    The duration of the time spent on the following activities on the date of the encounter was 30 minutes.   Preparing to see the patient (e.g., review of test, records)  Obtaining and/or reviewing separately obtained history  Performing a medically necessary exam and/or evaluation  Counseling and educating the patient/family/caregiver  Ordering medications, tests, or procedures  Referring and communicating with other healthcare professionals (when not reported separately)  Documenting clinical information in the electronic or paper health record  Independently interpreting results of tests/labs and communication of results to the family or caregiver  Care coordination (when not reported separately)   Neysa Hotter, MD 07/04/2023, 2:35 PM

## 2023-07-04 ENCOUNTER — Ambulatory Visit: Payer: 59 | Admitting: Psychiatry

## 2023-07-04 ENCOUNTER — Encounter: Payer: Self-pay | Admitting: Psychiatry

## 2023-07-04 ENCOUNTER — Ambulatory Visit (INDEPENDENT_AMBULATORY_CARE_PROVIDER_SITE_OTHER): Payer: 59 | Admitting: Psychiatry

## 2023-07-04 VITALS — BP 157/75 | HR 65 | Temp 97.3°F | Ht 69.0 in | Wt 176.2 lb

## 2023-07-04 DIAGNOSIS — G2401 Drug induced subacute dyskinesia: Secondary | ICD-10-CM | POA: Diagnosis not present

## 2023-07-04 DIAGNOSIS — F209 Schizophrenia, unspecified: Secondary | ICD-10-CM

## 2023-07-04 MED ORDER — OLANZAPINE 2.5 MG PO TABS: 2.5000 mg | ORAL_TABLET | Freq: Every day | ORAL | 0 refills | Status: DC

## 2023-07-04 NOTE — Patient Instructions (Signed)
Continue olanzapine 2.5 mg at night Next appointment: 4/29 at 3:30

## 2023-11-11 NOTE — Progress Notes (Unsigned)
 This encounter was created in error - please disregard.

## 2023-11-14 ENCOUNTER — Other Ambulatory Visit: Payer: Self-pay | Admitting: Psychiatry

## 2023-11-14 ENCOUNTER — Ambulatory Visit: Admitting: Psychiatry

## 2023-11-14 ENCOUNTER — Encounter: Payer: Self-pay | Admitting: Psychiatry

## 2023-11-14 ENCOUNTER — Telehealth: Payer: Self-pay | Admitting: Psychiatry

## 2023-11-14 MED ORDER — OLANZAPINE 2.5 MG PO TABS: 2.5000 mg | ORAL_TABLET | Freq: Every day | ORAL | 0 refills | Status: AC

## 2023-11-14 NOTE — Telephone Encounter (Signed)
 Patient care giver could not wait for appointment. Had to reschedule to June 12 and put on wait list. Will need a refill on Olanzapine  2.5 mg until the next appointment

## 2023-11-14 NOTE — Telephone Encounter (Signed)
 Ordered

## 2023-11-27 LAB — LAB REPORT - SCANNED
A1c: 6.5
Albumin, Urine POC: 149.7
Albumin/Creatinine Ratio, Urine, POC: 215
Creatinine, POC: 69.7 mg/dL
EGFR: 52
HM Hepatitis Screen: NEGATIVE

## 2023-12-19 NOTE — Progress Notes (Signed)
 [facility] MD/PA/NP OP Progress Note  12/28/2023 9:12 AM Calvin Bates  MRN:  [id number]  Chief Complaint:  Chief Complaint  Patient presents with   Follow-up   HPI:  This is a follow-up appointment for schizophrenia, dyskinesia.  He states that he has right leg weakness for a while.  He did a test for circulation, and nothing was found.  He tends to feel aggravated due to the issues related to speed. He do what I can. He also reports that there are different people at home.  However, he denies feeling irritated by others.  He enjoys reading books for the educational topics.  He enjoys learning.  He also listens to music, which helps him to feel calm.  He denies concern about sleep except that he has nocturia.  He sleeps from 8 pm-2am.  He denies hallucinations, paranoia.  He denies feeling depressed or anxiety.  He denies SI, HI. He denies any weakness except his right leg.Although he is aware of lipsmacking, it is unchanged, and he denies concern about this.   Caregiver presents to the visit.  She denies any concern.  He is seen by primary care every 4 weeks.  He did rehab for 6 weeks, and they thought he does not have issues.  However, he started to have this weakness again.  She agrees to contact PT if they can follow him up.    Wt Readings from Last 3 Encounters:  12/28/23 163 lb 12.8 oz (74.3 kg)  11/14/23 168 lb 12.8 oz (76.6 kg)  07/04/23 176 lb 3.2 oz (79.9 kg)     Household: (family home) Marital status:divorced, married three times Number of children:2 (estranged relationship) Employment: unemployed, on disability Education: 9th grade, (He left school having issues with some group of people who causes trouble). obtained GED   Last PCP / ongoing medical evaluation  Visit Diagnosis:    ICD-10-CM   1. Schizophrenia, unspecified type (HCC)  F20.9     2. Tardive dyskinesia  G24.01       Past Psychiatric History: Please see initial evaluation for full details. I have reviewed  the history. No updates at this time.     Past Medical History:  Past Medical History:  Diagnosis Date   Diabetes mellitus without complication (HCC)    Diabetic retinopathy (HCC)    Hyperlipemia    Hypertension    Neuropathy    Psychosis (HCC)    Stroke [facility])     Past Surgical History:  Procedure Laterality Date   BACK SURGERY      Family Psychiatric History: Please see initial evaluation for full details. I have reviewed the history. No updates at this time.     Family History: History reviewed. No pertinent family history.  Social History:  Social History   Socioeconomic History   Marital status: Divorced    Spouse name: Not on file   Number of children: 2   Years of education: Not on file   Highest education level: GED or equivalent  Occupational History   Not on file  Tobacco Use   Smoking status: Never   Smokeless tobacco: Never  Substance and Sexual Activity   Alcohol use: No    Alcohol/week: 0.0 standard drinks of alcohol   Drug use: No   Sexual activity: Not Currently  Other Topics Concern   Not on file  Social History Narrative   Not on file   Social Drivers of Health   Financial Resource Strain: Not on file  Food Insecurity: Not on file  Transportation Needs: Not on file  Physical Activity: Not on file  Stress: Not on file  Social Connections: Not on file    Allergies:  Allergies  Allergen Reactions   Seasonal Ic [Octacosanol]     Metabolic Disorder Labs: No results found for: HGBA1C, MPG No results found for: PROLACTIN No results found for: CHOL, TRIG, HDL, CHOLHDL, VLDL, LDLCALC No results found for: TSH  Therapeutic Level Labs: No results found for: LITHIUM No results found for: VALPROATE No results found for: CBMZ  Current Medications: Current Outpatient Medications  Medication Sig Dispense Refill   acetaminophen (TYLENOL) 325 MG tablet Take 650 mg by mouth every 6 (six) hours as needed.      albuterol  (PROVENTIL  HFA;VENTOLIN  HFA) 108 (90 Base) MCG/ACT inhaler Inhale 2 puffs into the lungs every 6 (six) hours as needed for wheezing or shortness of breath. 1 Inhaler 0   amLODipine (NORVASC) 10 MG tablet Take 10 mg by mouth daily.     Ascorbic Acid (VITAMIN C) 1000 MG tablet Take 250 mg by mouth daily.     atorvastatin (LIPITOR) 40 MG tablet Take 20 mg by mouth daily.     cholecalciferol (VITAMIN D3) 25 MCG (1000 UNIT) tablet Take 1,000 Units by mouth daily.     co-enzyme Q-10 30 MG capsule Take by mouth daily.     fluticasone (FLONASE) 50 MCG/ACT nasal spray Place into both nostrils at bedtime.     guaifenesin (ROBITUSSIN) 100 MG/5ML syrup Take 200 mg by mouth 3 (three) times daily as needed for cough.     lisinopril (PRINIVIL,ZESTRIL) 40 MG tablet Take 40 mg by mouth daily.     loperamide (IMODIUM) 2 MG capsule Take by mouth as needed for diarrhea or loose stools.     magnesium hydroxide (MILK OF MAGNESIA) 400 MG/5ML suspension Take by mouth daily as needed for mild constipation.     magnesium oxide (MAG-OX) 400 MG tablet Take 1 tablet by mouth 2 (two) times daily.     metFORMIN (GLUCOPHAGE) 500 MG tablet Take 500 mg by mouth 2 (two) times daily with a meal.     metoprolol tartrate (LOPRESSOR) 25 MG tablet Take 25 mg by mouth 2 (two) times daily.     Multiple Vitamin (MULTIVITAMIN) capsule Take 1 capsule by mouth daily.     Multiple Vitamins-Minerals (CENTRUM SILVER) tablet Take 1 tablet by mouth daily.     OLANZapine  (ZYPREXA ) 2.5 MG tablet Take 1 tablet (2.5 mg total) by mouth at bedtime. 90 tablet 0   Omega-3 Fatty Acids (FISH OIL) 1000 MG CAPS Take 1 capsule by mouth 2 (two) times daily.     tamsulosin (FLOMAX) 0.4 MG CAPS capsule Take 0.4 mg by mouth daily.     Wheat Dextrin (BENEFIBER) POWD Take 3 g by mouth in the morning, at noon, and at bedtime.     OLANZapine  (ZYPREXA ) 2.5 MG tablet Take 1 tablet (2.5 mg total) by mouth at bedtime. 150 tablet 0   thiamine (VITAMIN B-1) 100  MG tablet Take 100 mg by mouth daily.     thiamine 100 MG tablet Take 100 mg by mouth daily.     No current facility-administered medications for this visit.     Musculoskeletal: Strength & Muscle Tone: decreased in his right leg Gait & Station: unsteady (using a walker) Patient leans: N/A  Psychiatric Specialty Exam: Review of Systems  Psychiatric/Behavioral: Negative.    All other systems reviewed and are negative.  Blood pressure 132/68, pulse 77, temperature 98.7 F (37.1 C), temperature source Temporal, height 5' 9 (1.753 m), weight 163 lb 12.8 oz (74.3 kg), SpO2 98%.Body mass index is 24.19 kg/m.  General Appearance: Well Groomed  Eye Contact:  Good  Speech:  Clear and Coherent  Volume:  Normal  Mood:  good  Affect:  Appropriate, Congruent, and Full Range  Thought Process:  Coherent  Orientation:  Full (Time, Place, and Person)  Thought Content: Logical   Suicidal Thoughts:  No  Homicidal Thoughts:  No  Memory:  Immediate;   Good  Judgement:  Good  Insight:  Good  Psychomotor Activity:  TD, Normal tone, no rigidity, no resting/postural tremors,   Concentration:  Concentration: Good and Attention Span: Good  Recall:  Good  Fund of Knowledge: Good  Language: Good  Akathisia:  No  Handed:  Right  AIMS (if indicated): 2- lip smacking  Assets:  Communication Skills Desire for Improvement  ADL's:  Intact  Cognition: WNL  Sleep:  Fair   Screenings: GAD-7    Flowsheet Row Office Visit from 07/28/2022 in [facility]  Total GAD-7 Score 0   PHQ2-9    Flowsheet Row Office Visit from 07/28/2022 in [facility] Office Visit from 05/16/2022 in [facility]  PHQ-2 Total Score 0 0   Flowsheet Row ED from 06/24/2023 in [facility] at [facility] Visit from 07/28/2022 in [facility] ED from 01/30/2021 in [facility] Department at [facility]  C-SSRS RISK CATEGORY No Risk No Risk No Risk     Assessment and Plan:  NAWAF STRANGE is a 73 y.o. year old male with a history of psychosis,  tardive dyskinesia, CVA, diabetes, hyperlipidemia, hypertension, who presents for follow up appointment for below.   1. Schizophrenia, unspecified type (HCC) History:according to the caregiver at family home, he has been stable for ten years on olanzapine . Dose has been tapered down. Had paranoia when he missed to take it for 2 days in the past  He continues to demonstrate a linear thought process, and he is calm during the visit, which has been consistent since the last visit.  The staff denies any behavior concern except he has nocturia.  Will continue current dose of olanzapine  to target schizophrenia. Discussed with the patient about the increased risk of CVA from olanzapine  for people with dementia.  He was previously advised to be back on aspirin after discussion with PCP given his prior history of stroke.   2. Tardive dyskinesia The exam is notable for lipsmacking.  He is not interested in VMAT 2 inhibitor, and denies any concern about this.Aaron Aas He will not be a good candidate to discontinue olanzapine  due to relapse in his symptoms as above.  Will continue to assess this.    # high risk medication use He is due for check metabolic panels.  The caregiver agrees to contact primary care to send the results to our office.    Last checked  EKG HR 60, QTc311msec 02/2023  Lipid panels wnl 10/2022  [facility]    # right leg weakness Significant worsening.  Exam is notable for gait disturbances with right leg weakness.  According to the caregiver and the patient, vascular imaging of the leg was performed, and he was discharged from PT (and it worsened since then).  The caregiver agrees to contact  PT again to see if he qualifies for the physical  therapy again.  Noted that he has primary care visit every 4 weeks at the facility, and they are aware of his current condition.    Plan Continue olanzapine  2.5 mg at night  Next appointment: 10/13 at 11:30, IP   The patient demonstrates the following risk factors for suicide: Chronic risk factors for suicide include: psychiatric disorder of schizophrenia . Acute risk factors for suicide include: unemployment. Protective factors for this patient include: positive social support and hope for the future. Considering these factors, the overall suicide risk at this point appears to be low. Patient is appropriate for outpatient follow up.   A total of 35 minutes was spent on the following activities during the encounter date, which includes but is not limited to: preparing to see the patient (e.g., reviewing tests and records), obtaining and/or reviewing separately obtained history, performing a medically necessary examination or evaluation, counseling and educating the patient, family, or caregiver, ordering medications, tests, or procedures, referring and communicating with other healthcare professionals (when not reported separately), documenting clinical information in the electronic or paper health record, independently interpreting test or lab results and communicating these results to the family or caregiver, and coordinating care (when not reported separately).  This visit involved a longitudinal and complex condition requiring extended medical decision-making, coordination of care, and management beyond what is typically captured in CPT 99214. The complexity of the patient's condition justifies the use of G2211.   Collaboration of Care: Collaboration of Care: Other collaboration with his caregiver  Patient/Guardian was advised Release of Information must be obtained prior to any record release in order to collaborate their care with an outside provider. Patient/Guardian was advised if they have not  already done so to contact the registration department to sign all necessary forms in order for us  to release information regarding their care.   Consent: Patient/Guardian gives verbal consent for treatment and assignment of benefits for services provided during this visit. Patient/Guardian expressed understanding and agreed to proceed.    Todd Fossa, MD 12/28/2023, 9:12 AM

## 2023-12-28 ENCOUNTER — Encounter: Payer: Self-pay | Admitting: Psychiatry

## 2023-12-28 ENCOUNTER — Ambulatory Visit: Admitting: Psychiatry

## 2023-12-28 VITALS — BP 132/68 | HR 77 | Temp 98.7°F | Ht 69.0 in | Wt 163.8 lb

## 2023-12-28 DIAGNOSIS — G2401 Drug induced subacute dyskinesia: Secondary | ICD-10-CM

## 2023-12-28 DIAGNOSIS — F209 Schizophrenia, unspecified: Secondary | ICD-10-CM

## 2023-12-28 NOTE — Patient Instructions (Signed)
 Continue olanzapine  2.5 mg at night  Next appointment: 10/13 at 11:30

## 2024-01-02 ENCOUNTER — Telehealth: Payer: Self-pay | Admitting: Psychiatry

## 2024-01-02 NOTE — Telephone Encounter (Signed)
 Reviewed labs.     Last checked  EKG HR , QTcmsec   Lipid panels Chol 108, LDL 34, wnl 11/2023  HbA1c 6.5 H 11/2023   CBC- Hm 11.1, Ht 33.7 BUN 24, Creat 1.42 (eGFR 52) AST/ALT- wnl

## 2024-01-13 ENCOUNTER — Ambulatory Visit
Admission: EM | Admit: 2024-01-13 | Discharge: 2024-01-13 | Disposition: A | Discharge status: Home / Self Care | Attending: Physician Assistant | Admitting: Physician Assistant

## 2024-01-13 DIAGNOSIS — R5381 Other malaise: Secondary | ICD-10-CM | POA: Insufficient documentation

## 2024-01-13 DIAGNOSIS — R300 Dysuria: Secondary | ICD-10-CM | POA: Diagnosis present

## 2024-01-13 DIAGNOSIS — R5383 Other fatigue: Secondary | ICD-10-CM | POA: Insufficient documentation

## 2024-01-13 LAB — POCT URINALYSIS DIP (MANUAL ENTRY)
Bilirubin, UA: NEGATIVE
Blood, UA: NEGATIVE
Glucose, UA: NEGATIVE mg/dL
Ketones, POC UA: NEGATIVE mg/dL
Leukocytes, UA: NEGATIVE
Nitrite, UA: NEGATIVE
Protein Ur, POC: 30 mg/dL — AB
Spec Grav, UA: 1.015 (ref 1.010–1.025)
Urobilinogen, UA: 0.2 U/dL
pH, UA: 7.5 (ref 5.0–8.0)

## 2024-01-13 MED ORDER — CEPHALEXIN 500 MG PO CAPS: 500.0000 mg | ORAL_CAPSULE | Freq: Two times a day (BID) | ORAL | 0 refills | Status: AC

## 2024-01-13 NOTE — ED Provider Notes (Signed)
 Calvin Bates    CSN: [id number] Arrival date & time: 01/13/24  1048      History   Chief Complaint Chief Complaint  Patient presents with   Urinary Frequency    HPI Calvin Bates is a 73 y.o. male.   Patient presents today with a 24-hour history of UTI symptoms including urgency, frequency, dysuria.  Denies any hematuria, abdominal pain, penile discharge, genital lesion, rectal pain, lower back pain, fever, nausea, vomiting.  Denies history of recurrent UTI.  He does have a history of diabetes but does not take SGLT2 inhibitor.  Denies any recent antibiotics.  Denies any recent hospitalization, urogenital procedure, catheterization, history of nephrolithiasis.  He has not tried any over-the-counter medication for symptom management.  He does report just feeling poorly and has been more fatigued.  Denies any muscle weakness but does report that he feels overall weaker and has been having more trouble walking; denies any recent falls or injury.  He has been drinking plenty of fluid.    Past Medical History:  Diagnosis Date   Diabetes mellitus without complication (HCC)    Diabetic retinopathy (HCC)    Hyperlipemia    Hypertension    Neuropathy    Psychosis (HCC)    Stroke (HCC)     There are no active problems to display for this patient.   Past Surgical History:  Procedure Laterality Date   BACK SURGERY         Home Medications    Prior to Admission medications   Medication Sig Start Date End Date Taking? Authorizing Provider  cephALEXin (KEFLEX) 500 MG capsule Take 1 capsule (500 mg total) by mouth 2 (two) times daily for 7 days. 01/13/24 01/20/24 Yes Makyia Erxleben K, PA-C  cloNIDine (CATAPRES) 0.1 MG tablet Take by mouth. 07/31/23  Yes [provider]  gatifloxacin (ZYMAXID) 0.5 % SOLN Place 1 drop into the right eye 4 (four) times daily. 01/01/24  Yes [provider]  hydrALAZINE (APRESOLINE) 25 MG tablet Take 25 mg by mouth 2 (two) times  daily. 01/08/24  Yes [provider]  ketorolac (ACULAR) 0.5 % ophthalmic solution Place 1 drop into the right eye 4 (four) times daily. 01/01/24  Yes [provider]  levOCARNitine (L-CARNITINE) 500 MG TABS Take 1 tablet by mouth daily. 07/31/23  Yes [provider]  prednisoLONE acetate (PRED FORTE) 1 % ophthalmic suspension Place 1 drop into the right eye 4 (four) times daily. 01/01/24  Yes [provider]  acetaminophen (TYLENOL) 325 MG tablet Take 650 mg by mouth every 6 (six) hours as needed.    [provider]  albuterol  (PROVENTIL  HFA;VENTOLIN  HFA) 108 (90 Base) MCG/ACT inhaler Inhale 2 puffs into the lungs every 6 (six) hours as needed for wheezing or shortness of breath. Patient not taking: Reported on 01/13/2024 09/30/17   Goodman, Graydon, MD  amLODipine (NORVASC) 10 MG tablet Take 10 mg by mouth daily. 01/05/21   [provider]  Ascorbic Acid (VITAMIN C) 1000 MG tablet Take 250 mg by mouth daily. Patient not taking: Reported on 01/13/2024    [provider]  atorvastatin (LIPITOR) 40 MG tablet Take 20 mg by mouth daily. 01/05/21   [provider]  cholecalciferol (VITAMIN D3) 25 MCG (1000 UNIT) tablet Take 1,000 Units by mouth daily.    [provider]  co-enzyme Q-10 30 MG capsule Take by mouth daily.    [provider]  fluticasone (FLONASE) 50 MCG/ACT nasal spray Place into  both nostrils at bedtime. 02/12/21   [provider]  guaifenesin (ROBITUSSIN) 100 MG/5ML syrup Take 200 mg by mouth 3 (three) times daily as needed for cough.    [provider]  lisinopril (PRINIVIL,ZESTRIL) 40 MG tablet Take 40 mg by mouth daily. 11/03/14   [provider]  loperamide (IMODIUM) 2 MG capsule Take by mouth as needed for diarrhea or loose stools.    [provider]  loperamide (IMODIUM) 2 MG capsule Take 2 mg by mouth.    [provider]  magnesium hydroxide (MILK OF  MAGNESIA) 400 MG/5ML suspension Take by mouth daily as needed for mild constipation.    [provider]  magnesium oxide (MAG-OX) 400 MG tablet Take 1 tablet by mouth 2 (two) times daily. 10/16/20   [provider]  metFORMIN (GLUCOPHAGE) 500 MG tablet Take 500 mg by mouth 2 (two) times daily with a meal. 11/03/14   [provider]  metoprolol tartrate (LOPRESSOR) 25 MG tablet Take 25 mg by mouth 2 (two) times daily. 11/03/14   [provider]  Multiple Vitamin (MULTIVITAMIN) capsule Take 1 capsule by mouth daily.    [provider]  Multiple Vitamins-Minerals (CENTRUM SILVER) tablet Take 1 tablet by mouth daily.    [provider]  OLANZapine  (ZYPREXA ) 2.5 MG tablet Take 1 tablet (2.5 mg total) by mouth at bedtime. 07/26/23 12/28/23  Vickey Mettle, MD  OLANZapine  (ZYPREXA ) 2.5 MG tablet Take 1 tablet (2.5 mg total) by mouth at bedtime. 12/23/23 03/22/24  Vickey Mettle, MD  Omega-3 Fatty Acids (FISH OIL) 1000 MG CAPS Take 1 capsule by mouth 2 (two) times daily. 08/31/20   [provider]  tamsulosin (FLOMAX) 0.4 MG CAPS capsule Take 0.4 mg by mouth daily.    [provider]  thiamine (VITAMIN B-1) 100 MG tablet Take 100 mg by mouth daily.    [provider]  thiamine 100 MG tablet Take 100 mg by mouth daily.    [provider]  Wheat Dextrin (BENEFIBER) POWD Take 3 g by mouth in the morning, at noon, and at bedtime.    [provider]    Family History History reviewed. No pertinent family history.  Social History Social History   Tobacco Use   Smoking status: Never   Smokeless tobacco: Never  Substance Use Topics   Alcohol use: No    Alcohol/week: 0.0 standard drinks of alcohol   Drug use: No     Allergies   Seasonal ic [octacosanol]   Review of Systems Review of Systems  Constitutional:  Positive for activity change and fatigue. Negative for appetite change and fever.  Respiratory:  Negative  for shortness of breath.   Cardiovascular:  Negative for chest pain.  Gastrointestinal:  Negative for abdominal pain, diarrhea, nausea and vomiting.  Genitourinary:  Positive for dysuria, frequency and urgency. Negative for flank pain, hematuria and penile pain.  Musculoskeletal:  Negative for arthralgias, back pain and myalgias.  Neurological:  Positive for weakness (Generalized). Negative for numbness.     Physical Exam Triage Vital Signs ED Triage Vitals  Encounter Vitals Group     BP 01/13/24 1104 136/66     Girls Systolic BP Percentile --      Girls Diastolic BP Percentile --      Boys Systolic BP Percentile --      Boys Diastolic BP Percentile --      Pulse Rate 01/13/24 1104 76     Resp 01/13/24 1104 17  Temp 01/13/24 1104 97.8 F (36.6 C)     Temp Source 01/13/24 1104 Tympanic     SpO2 01/13/24 1104 97 %     Weight --      Height --      Head Circumference --      Peak Flow --      Pain Score 01/13/24 1105 1     Pain Loc --      Pain Education --      Exclude from Growth Chart --    No data found.  Updated Vital Signs BP 136/66 (BP Location: Left Arm)   Pulse 76   Temp 97.8 F (36.6 C) (Tympanic)   Resp 17   SpO2 97%   Visual Acuity Right Eye Distance:   Left Eye Distance:   Bilateral Distance:    Right Eye Near:   Left Eye Near:    Bilateral Near:     Physical Exam Vitals reviewed.  Constitutional:      General: He is awake.     Appearance: Normal appearance. He is well-developed. He is not ill-appearing.     Comments: Very pleasant male appears stated age in no acute distress sitting comfortably in exam room in a wheelchair  HENT:     Head: Normocephalic and atraumatic.     Nose: Nose normal.     Mouth/Throat:     Comments: Random tongue and oral movement  Cardiovascular:     Rate and Rhythm: Normal rate and regular rhythm.     Heart sounds: Normal heart sounds, S1 normal and S2 normal. No murmur heard. Pulmonary:     Effort: Pulmonary  effort is normal.     Breath sounds: Normal breath sounds. No stridor. No wheezing, rhonchi or rales.     Comments: Clear to auscultation bilaterally Abdominal:     General: Bowel sounds are normal.     Palpations: Abdomen is soft.     Tenderness: There is no abdominal tenderness. There is no right CVA tenderness, left CVA tenderness, guarding or rebound.     Comments: Benign abdominal exam.  No CVA tenderness.   Musculoskeletal:     Cervical back: No tenderness or bony tenderness.     Thoracic back: No tenderness or bony tenderness.     Lumbar back: No tenderness or bony tenderness.     Comments: Strength 5/5 bilateral upper and lower extremities  Back: No pain percussion of vertebrae.  No tenderness palpation of paraspinal muscles.  No deformity or step-off noted.   Neurological:     Mental Status: He is alert.   Psychiatric:        Behavior: Behavior is cooperative.      UC Treatments / Results  Labs (all labs ordered are listed, but only abnormal results are displayed) Labs Reviewed  POCT URINALYSIS DIP (MANUAL ENTRY) - Abnormal; Notable for the following components:      Result Value   Protein Ur, POC =30 (*)    All other components within normal limits  URINE CULTURE  CBC WITH DIFFERENTIAL/PLATELET  COMPREHENSIVE METABOLIC PANEL WITH GFR    EKG   Radiology No results found.  Procedures Procedures (including critical care time)  Medications Ordered in UC Medications - No data to display  Initial Impression / Assessment and Plan / UC Course  I have reviewed the triage vital signs and the nursing notes.  Pertinent labs & imaging results that were available during my care of the patient were reviewed by me  and considered in my medical decision making (see chart for details).     Patient is well-appearing, afebrile, nontoxic, nontachycardic.  Vital signs and physical exam are reassuring with no indication for emergent evaluation or imaging.  UA was obtained  that showed trace protein but otherwise no significant abnormalities.  He does have classic presentation of UTI so we will empirically treat with cephalexin 500 mg twice daily for 7 days while awaiting culture result.  We did discuss that if his culture is negative we will stop the medication and he would need to be reevaluated by his PCP to consider additional workup including potential referral to urology.  No indication for dose adjustment of cephalexin based on metabolic panel from 06/24/2023 with creatinine of 1.52 calculated creatinine clearance of 45.26 mL/min.  Given his generalized fatigue and malaise we will also obtain basic blood work including CBC and CMP.  We will contact him if this is abnormal.  He is to avoid prolonged exposure to the high heat and make sure that he is pushing fluids.  We discussed that if anything worsens or changes and he has fever, abdominal pain, nausea/vomiting, weakness, worsening fatigue/malaise he needs to go to the emergency room.  Recommend close follow-up with his primary care.  Strict return precautions given.  All questions were answered to patient and caregiver satisfaction.  Final Clinical Impressions(s) / UC Diagnoses   Final diagnoses:  Dysuria  Malaise and fatigue     Discharge Instructions      His urine is not convincing for an infection, however, given his symptoms we are going to start an antibiotic.  Start cephalexin twice daily for 7 days.  We will send his urine off for culture and contact you if need to stop or change his antibiotics based on the culture result.  I will contact you if any of his blood work is abnormal.  Make sure he is drinking plenty of fluids.  Try to avoid prolonged heat exposure.  Follow-up with primary care next week.  If anything worsens and he has fever, nausea/vomiting, difficulty passing urine, blood in his urine, abdominal pain, weakness he needs to go to the emergency room immediately.     ED Prescriptions      Medication Sig Dispense Auth. Provider   cephALEXin (KEFLEX) 500 MG capsule Take 1 capsule (500 mg total) by mouth 2 (two) times daily for 7 days. 14 capsule Valita Righter K, PA-C      PDMP not reviewed this encounter.   Sherrell Rocky POUR, PA-C 01/13/24 1143

## 2024-01-13 NOTE — Discharge Instructions (Signed)
 His urine is not convincing for an infection, however, given his symptoms we are going to start an antibiotic.  Start cephalexin twice daily for 7 days.  We will send his urine off for culture and contact you if need to stop or change his antibiotics based on the culture result.  I will contact you if any of his blood work is abnormal.  Make sure he is drinking plenty of fluids.  Try to avoid prolonged heat exposure.  Follow-up with primary care next week.  If anything worsens and he has fever, nausea/vomiting, difficulty passing urine, blood in his urine, abdominal pain, weakness he needs to go to the emergency room immediately.

## 2024-01-13 NOTE — ED Triage Notes (Signed)
 Pt being seen in UC for urinary burning, frequency, and dysuria x1 day.  Pt reports more difficulty with walking over last couple of days. Pt reports hx of neuropathy.   Pt denies fever, n/v.

## 2024-01-14 ENCOUNTER — Emergency Department
Admission: EM | Admit: 2024-01-14 | Discharge: 2024-01-14 | Disposition: A | Discharge status: Skilled Nursing Facility | Attending: Emergency Medicine | Admitting: Emergency Medicine

## 2024-01-14 ENCOUNTER — Other Ambulatory Visit: Payer: Self-pay

## 2024-01-14 ENCOUNTER — Emergency Department

## 2024-01-14 DIAGNOSIS — S0101XA Laceration without foreign body of scalp, initial encounter: Secondary | ICD-10-CM | POA: Insufficient documentation

## 2024-01-14 DIAGNOSIS — S0990XA Unspecified injury of head, initial encounter: Secondary | ICD-10-CM | POA: Diagnosis present

## 2024-01-14 DIAGNOSIS — W01198A Fall on same level from slipping, tripping and stumbling with subsequent striking against other object, initial encounter: Secondary | ICD-10-CM | POA: Insufficient documentation

## 2024-01-14 DIAGNOSIS — R55 Syncope and collapse: Secondary | ICD-10-CM | POA: Insufficient documentation

## 2024-01-14 DIAGNOSIS — E119 Type 2 diabetes mellitus without complications: Secondary | ICD-10-CM | POA: Insufficient documentation

## 2024-01-14 DIAGNOSIS — I10 Essential (primary) hypertension: Secondary | ICD-10-CM | POA: Insufficient documentation

## 2024-01-14 LAB — URINALYSIS, ROUTINE W REFLEX MICROSCOPIC
Bacteria, UA: NONE SEEN
Bilirubin Urine: NEGATIVE
Glucose, UA: >=500 mg/dL — AB
Hgb urine dipstick: NEGATIVE
Ketones, ur: NEGATIVE mg/dL
Leukocytes,Ua: NEGATIVE
Nitrite: NEGATIVE
Protein, ur: 100 mg/dL — AB
Specific Gravity, Urine: 1.014 (ref 1.005–1.030)
pH: 5 (ref 5.0–8.0)

## 2024-01-14 LAB — TROPONIN I (HIGH SENSITIVITY)
Troponin I (High Sensitivity): 5 ng/L (ref <18)
Troponin I (High Sensitivity): 5 ng/L (ref <18)

## 2024-01-14 LAB — COMPREHENSIVE METABOLIC PANEL WITH GFR
ALT: 22 IU/L (ref 0–44)
ALT: 23 U/L (ref 0–44)
AST: 26 IU/L (ref 0–40)
AST: 29 U/L (ref 15–41)
Albumin: 4 g/dL (ref 3.8–4.8)
Albumin: 3.6 g/dL (ref 3.5–5.0)
Alkaline Phosphatase: 70 IU/L (ref 44–121)
Alkaline Phosphatase: 66 U/L (ref 38–126)
Anion gap: 10 (ref 5–15)
BUN/Creatinine Ratio: 16 (ref 10–24)
BUN: 24 mg/dL (ref 8–27)
BUN: 36 mg/dL — ABNORMAL HIGH (ref 8–23)
Bilirubin Total: 0.3 mg/dL (ref 0.0–1.2)
CO2: 19 mmol/L — ABNORMAL LOW (ref 20–29)
CO2: 21 mmol/L — ABNORMAL LOW (ref 22–32)
Calcium: 9.5 mg/dL (ref 8.6–10.2)
Calcium: 9.3 mg/dL (ref 8.9–10.3)
Chloride: 104 mmol/L (ref 96–106)
Chloride: 103 mmol/L (ref 98–111)
Creatinine, Ser: 1.48 mg/dL — ABNORMAL HIGH (ref 0.76–1.27)
Creatinine, Ser: 1.69 mg/dL — ABNORMAL HIGH (ref 0.61–1.24)
GFR, Estimated: 42 mL/min — ABNORMAL LOW (ref >60)
Globulin, Total: 1.9 g/dL (ref 1.5–4.5)
Glucose, Bld: 260 mg/dL — ABNORMAL HIGH (ref 70–99)
Glucose: 94 mg/dL (ref 70–99)
Potassium: 5.2 mmol/L (ref 3.5–5.2)
Potassium: 4.8 mmol/L (ref 3.5–5.1)
Sodium: 138 mmol/L (ref 134–144)
Sodium: 134 mmol/L — ABNORMAL LOW (ref 135–145)
Total Bilirubin: 0.6 mg/dL (ref 0.0–1.2)
Total Protein: 5.9 g/dL — ABNORMAL LOW (ref 6.0–8.5)
Total Protein: 6.3 g/dL — ABNORMAL LOW (ref 6.5–8.1)
eGFR: 50 mL/min/{1.73_m2} — ABNORMAL LOW (ref >59)

## 2024-01-14 LAB — CBC WITH DIFFERENTIAL/PLATELET
Abs Immature Granulocytes: 0.01 10*3/uL (ref 0.00–0.07)
Basophils Absolute: 0.1 10*3/uL (ref 0.0–0.2)
Basophils Absolute: 0.1 10*3/uL (ref 0.0–0.1)
Basophils Relative: 1 %
Basos: 1 %
EOS (ABSOLUTE): 0.1 10*3/uL (ref 0.0–0.4)
Eos: 1 %
Eosinophils Absolute: 0 10*3/uL (ref 0.0–0.5)
Eosinophils Relative: 0 %
HCT: 33.4 % — ABNORMAL LOW (ref 39.0–52.0)
Hematocrit: 33.9 % — ABNORMAL LOW (ref 37.5–51.0)
Hemoglobin: 10.9 g/dL — ABNORMAL LOW (ref 13.0–17.7)
Hemoglobin: 11 g/dL — ABNORMAL LOW (ref 13.0–17.0)
Immature Grans (Abs): 0 10*3/uL (ref 0.0–0.1)
Immature Granulocytes: 0 %
Immature Granulocytes: 0 %
Lymphocytes Absolute: 0.9 10*3/uL (ref 0.7–3.1)
Lymphocytes Relative: 10 %
Lymphs Abs: 0.7 10*3/uL (ref 0.7–4.0)
Lymphs: 12 %
MCH: 30 pg (ref 26.6–33.0)
MCH: 30.3 pg (ref 26.0–34.0)
MCHC: 32.2 g/dL (ref 31.5–35.7)
MCHC: 32.9 g/dL (ref 30.0–36.0)
MCV: 93 fL (ref 79–97)
MCV: 92 fL (ref 80.0–100.0)
Monocytes Absolute: 0.6 10*3/uL (ref 0.1–0.9)
Monocytes Absolute: 0.5 10*3/uL (ref 0.1–1.0)
Monocytes Relative: 7 %
Monocytes: 8 %
Neutro Abs: 5.4 10*3/uL (ref 1.7–7.7)
Neutrophils Absolute: 6.1 10*3/uL (ref 1.4–7.0)
Neutrophils Relative %: 82 %
Neutrophils: 78 %
Platelets: 208 10*3/uL (ref 150–450)
Platelets: 208 10*3/uL (ref 150–400)
RBC: 3.63 x10E6/uL — ABNORMAL LOW (ref 4.14–5.80)
RBC: 3.63 MIL/uL — ABNORMAL LOW (ref 4.22–5.81)
RDW: 12.3 % (ref 11.6–15.4)
RDW: 13.4 % (ref 11.5–15.5)
WBC: 7.8 10*3/uL (ref 3.4–10.8)
WBC: 6.6 10*3/uL (ref 4.0–10.5)
nRBC: 0 % (ref 0.0–0.2)

## 2024-01-14 LAB — URINE CULTURE

## 2024-01-14 MED ORDER — SODIUM CHLORIDE 0.9 % IV BOLUS
500.0000 mL | Freq: Once | INTRAVENOUS | Status: AC
  Administered 2024-01-14: 500 mL via INTRAVENOUS

## 2024-01-14 MED ORDER — BACITRACIN ZINC 500 UNIT/GM EX OINT
TOPICAL_OINTMENT | Freq: Once | CUTANEOUS | Status: AC
  Administered 2024-01-14: 1 via TOPICAL
  Filled 2024-01-14: qty 0.9

## 2024-01-14 NOTE — ED Notes (Signed)
 Pt changed into hospital scrubs by this nt and current rn. Pt is in a wheelchair waiting on facility.

## 2024-01-14 NOTE — ED Notes (Signed)
 Update given to niece who states Ar[facility], owner of facility, will pick pt up for transportation home, facility number [phone]

## 2024-01-14 NOTE — ED Triage Notes (Addendum)
 Pt arrived via [facility] with c/o a fall with a head laceration. Pt denies LOC, blood thinners. Pt states that I was coming out of the bathroom and I just passed out, but I didn't loose conciousness, it's like losing oxygen for a moment, enough to fall down. Pt has a small laceration to the back of their head on the right side, bleeding is controlled prior to arrival. Pt is A&Ox4.

## 2024-01-14 NOTE — ED Notes (Addendum)
 Call placed to [phone], Algie, provided number for [employer], owner, [phone], number busy

## 2024-01-14 NOTE — ED Notes (Addendum)
 Pt changed into blue scrubs, pt clothing, glasses and case, nail clippers, ph charger, 2 wallets, cell phone, belt, calculator, and Vaseline  placed in pt belonging bag

## 2024-01-14 NOTE — ED Provider Notes (Signed)
 [facility] Provider Note    Event Date/Time   First MD Initiated Contact with Patient 01/14/24 1652     (approximate)   History   Fall and Head Laceration   HPI  Calvin Bates is a 73 y.o. male history of psychosis, hypertension, diabetes, hyperlipidemia, CVA and neuropathy presents emergency department via EMS from [facility].  Patient states that he was walking out of the bathroom when he suddenly passed out but did not completely lose consciousness.  States he hit his head on a work table.  No LOC.  Denies vomiting.  Denies chest pain      Physical Exam   Triage Vital Signs: ED Triage Vitals  Encounter Vitals Group     BP      Girls Systolic BP Percentile      Girls Diastolic BP Percentile      Boys Systolic BP Percentile      Boys Diastolic BP Percentile      Pulse      Resp      Temp      Temp src      SpO2      Weight      Height      Head Circumference      Peak Flow      Pain Score      Pain Loc      Pain Education      Exclude from Growth Chart     Most recent vital signs: Vitals:   01/14/24 2030 01/14/24 2045  BP: (!) 148/65   Pulse: 72 67  Resp: 17 18  Temp:    SpO2: 95% 96%     General: Awake, no distress.   CV:  Good peripheral perfusion. regular rate and  rhythm Resp:  Normal effort.  Abd:  No distention.   Other:  Scalp with large abrasion noted to the posterior scalp, area has already started to heal, patient is speaking in full sentences, appears to be alert and oriented, cranial nerves II through XII are grossly intact   ED Results / Procedures / Treatments   Labs (all labs ordered are listed, but only abnormal results are displayed) Labs Reviewed  COMPREHENSIVE METABOLIC PANEL WITH GFR - Abnormal; Notable for the following components:      Result Value   Sodium 134 (*)    CO2 21 (*)    Glucose, Bld 260 (*)    BUN 36 (*)    Creatinine, Ser 1.69 (*)    Total Protein 6.3 (*)    GFR,  Estimated 42 (*)    All other components within normal limits  CBC WITH DIFFERENTIAL/PLATELET - Abnormal; Notable for the following components:   RBC 3.63 (*)    Hemoglobin 11.0 (*)    HCT 33.4 (*)    All other components within normal limits  URINALYSIS, ROUTINE W REFLEX MICROSCOPIC - Abnormal; Notable for the following components:   Color, Urine YELLOW (*)    APPearance HAZY (*)    Glucose, UA >=500 (*)    Protein, ur 100 (*)    All other components within normal limits  TROPONIN I (HIGH SENSITIVITY)  TROPONIN I (HIGH SENSITIVITY)     EKG  EKG   RADIOLOGY CT of the head, C-spine    PROCEDURES:   Procedures  Critical Care:  no Chief Complaint  Patient presents with   Fall   Head Laceration      MEDICATIONS  ORDERED IN ED: Medications  bacitracin ointment (has no administration in time range)  sodium chloride  0.9 % bolus 500 mL (500 mLs Intravenous New Bag/Given 01/14/24 1947)     IMPRESSION / MDM / ASSESSMENT AND PLAN / ED COURSE  I reviewed the triage vital signs and the nursing notes.                              Differential diagnosis includes, but is not limited to, subdural, CVA, SAH, fracture, contusion, MI, syncope, fall  Patient's presentation is most consistent with acute illness / injury with system symptoms.    Labs and imaging ordered for syncope  Labs are reassuring CT of the head and cervical spine independently reviewed interpreted by me as being negative for any acute abnormality EKG shows normal sinus rhythm, no STEMI, see physician read  With the CT being normal, laceration on scalp is very superficial so will not suture or staple this area.  Have nursing staff apply bacitracin.  Given 500 mL of normal saline as his BUN was slightly increased.  Overall feel patient stable to be discharged to home.  Feel that MI is less likely as both troponins are normal.  CT of the head and cervical spine being negative indicate no CVA, SAH, or  subdural hematoma.  Patient is agreeable to this treatment plan.  He was discharged in stable condition.     FINAL CLINICAL IMPRESSION(S) / ED DIAGNOSES   Final diagnoses:  Laceration of scalp, initial encounter  Syncope, unspecified syncope type     Rx / DC Orders   ED Discharge Orders     None        Note:  This document was prepared using Dragon voice recognition software and may include unintentional dictation errors.    Gasper Devere ORN, PA-C 01/14/24 2052    Waymond Lorelle Cummins, MD 01/17/24 [phone]

## 2024-01-14 NOTE — ED Notes (Signed)
 Call to facility, report given, ETA 15 mins for transportation

## 2024-01-15 ENCOUNTER — Ambulatory Visit (HOSPITAL_COMMUNITY): Payer: Self-pay

## 2024-02-19 ENCOUNTER — Other Ambulatory Visit: Payer: Self-pay | Admitting: Internal Medicine

## 2024-02-19 DIAGNOSIS — M47816 Spondylosis without myelopathy or radiculopathy, lumbar region: Secondary | ICD-10-CM

## 2024-03-15 ENCOUNTER — Telehealth: Payer: Self-pay

## 2024-03-15 NOTE — Telephone Encounter (Signed)
 donna euliss left a message that they feel that the olanzapine  is what is making him fall so much. she like to speak with you about this.

## 2024-03-15 NOTE — Telephone Encounter (Signed)
 left message that Mr. Johndrow will need to come by office to sign a ROI to be able to speak with her. also gave infor from dr. vickey per dr. vickey order.

## 2024-03-15 NOTE — Telephone Encounter (Signed)
 Do we currently have ROI on file to speak with her? If not, please obtain the one. It would be also helpful to clarify her role in his care.  Also, please ensure that he is evaluated by his primary care provider. While olanzapine  can contribute to falls, especially in older adults, he's been on this medication for quite some time. There are many other potential medical factors that could be contributing, and a thorough evaluation is important to rule out or address this.

## 2024-03-21 NOTE — Progress Notes (Deleted)
 [facility] MD/PA/NP OP Progress Note  03/21/2024 2:14 PM Calvin Bates  MRN:  [id number]  Chief Complaint: No chief complaint on file.  HPI: ***    Household: (family home) Marital status:divorced, married three times Number of children:2 (estranged relationship) Employment: unemployed, on disability Education: 9th grade, (He left school having issues with some group of people who causes trouble). obtained GED   Last PCP / ongoing medical evaluation  Visit Diagnosis: No diagnosis found.  Past Psychiatric History: Please see initial evaluation for full details. I have reviewed the history. No updates at this time.     Past Medical History:  Past Medical History:  Diagnosis Date   Diabetes mellitus without complication (HCC)    Diabetic retinopathy (HCC)    Hyperlipemia    Hypertension    Neuropathy    Psychosis (HCC)    Stroke [facility])     Past Surgical History:  Procedure Laterality Date   BACK SURGERY      Family Psychiatric History: Please see initial evaluation for full details. I have reviewed the history. No updates at this time.     Family History: No family history on file.  Social History:  Social History   Socioeconomic History   Marital status: Divorced    Spouse name: Not on file   Number of children: 2   Years of education: Not on file   Highest education level: GED or equivalent  Occupational History   Not on file  Tobacco Use   Smoking status: Never   Smokeless tobacco: Never  Substance and Sexual Activity   Alcohol use: No    Alcohol/week: 0.0 standard drinks of alcohol   Drug use: No   Sexual activity: Not Currently  Other Topics Concern   Not on file  Social History Narrative   Not on file   Social Drivers of Health   Financial Resource Strain: Not on file  Food Insecurity: Not on file  Transportation Needs: Not on file  Physical Activity: Not on file  Stress: Not on file  Social Connections: Not on file    Allergies:  Allergies   Allergen Reactions   Seasonal Ic [Octacosanol]     Metabolic Disorder Labs: No results found for: HGBA1C, MPG No results found for: PROLACTIN No results found for: CHOL, TRIG, HDL, CHOLHDL, VLDL, LDLCALC No results found for: TSH  Therapeutic Level Labs: No results found for: LITHIUM No results found for: VALPROATE No results found for: CBMZ  Current Medications: Current Outpatient Medications  Medication Sig Dispense Refill   acetaminophen  (TYLENOL ) 325 MG tablet Take 650 mg by mouth every 6 (six) hours as needed.     albuterol  (PROVENTIL  HFA;VENTOLIN  HFA) 108 (90 Base) MCG/ACT inhaler Inhale 2 puffs into the lungs every 6 (six) hours as needed for wheezing or shortness of breath. (Patient not taking: Reported on 01/13/2024) 1 Inhaler 0   amLODipine  (NORVASC ) 10 MG tablet Take 10 mg by mouth daily.     Ascorbic Acid (VITAMIN C) 1000 MG tablet Take 250 mg by mouth daily. (Patient not taking: Reported on 01/13/2024)     atorvastatin  (LIPITOR) 40 MG tablet Take 20 mg by mouth daily.     cholecalciferol (VITAMIN D3) 25 MCG (1000 UNIT) tablet Take 1,000 Units by mouth daily.     cloNIDine (CATAPRES) 0.1 MG tablet Take by mouth.     co-enzyme Q-10 30 MG capsule Take by mouth daily.     fluticasone  (FLONASE ) 50 MCG/ACT nasal spray Place into both  nostrils at bedtime.     gatifloxacin (ZYMAXID) 0.5 % SOLN Place 1 drop into the right eye 4 (four) times daily.     guaifenesin  (ROBITUSSIN) 100 MG/5ML syrup Take 200 mg by mouth 3 (three) times daily as needed for cough.     hydrALAZINE  (APRESOLINE ) 25 MG tablet Take 25 mg by mouth 2 (two) times daily.     ketorolac (ACULAR) 0.5 % ophthalmic solution Place 1 drop into the right eye 4 (four) times daily.     levOCARNitine (L-CARNITINE) 500 MG TABS Take 1 tablet by mouth daily.     lisinopril  (PRINIVIL ,ZESTRIL ) 40 MG tablet Take 40 mg by mouth daily.     loperamide (IMODIUM) 2 MG capsule Take by mouth as needed for  diarrhea or loose stools.     loperamide (IMODIUM) 2 MG capsule Take 2 mg by mouth.     magnesium hydroxide (MILK OF MAGNESIA) 400 MG/5ML suspension Take by mouth daily as needed for mild constipation.     magnesium oxide (MAG-OX) 400 MG tablet Take 1 tablet by mouth 2 (two) times daily.     metFORMIN (GLUCOPHAGE) 500 MG tablet Take 500 mg by mouth 2 (two) times daily with a meal.     metoprolol  tartrate (LOPRESSOR ) 25 MG tablet Take 25 mg by mouth 2 (two) times daily.     Multiple Vitamin (MULTIVITAMIN) capsule Take 1 capsule by mouth daily.     Multiple Vitamins-Minerals (CENTRUM SILVER) tablet Take 1 tablet by mouth daily.     OLANZapine  (ZYPREXA ) 2.5 MG tablet Take 1 tablet (2.5 mg total) by mouth at bedtime. 150 tablet 0   OLANZapine  (ZYPREXA ) 2.5 MG tablet Take 1 tablet (2.5 mg total) by mouth at bedtime. 90 tablet 0   Omega-3 Fatty Acids (FISH OIL) 1000 MG CAPS Take 1 capsule by mouth 2 (two) times daily.     prednisoLONE acetate (PRED FORTE) 1 % ophthalmic suspension Place 1 drop into the right eye 4 (four) times daily.     tamsulosin (FLOMAX) 0.4 MG CAPS capsule Take 0.4 mg by mouth daily.     thiamine  (VITAMIN B-1) 100 MG tablet Take 100 mg by mouth daily.     thiamine  100 MG tablet Take 100 mg by mouth daily.     Wheat Dextrin (BENEFIBER) POWD Take 3 g by mouth in the morning, at noon, and at bedtime.     No current facility-administered medications for this visit.     Musculoskeletal: Strength & Muscle Tone: within normal limits Gait & Station: normal Patient leans: N/A  Psychiatric Specialty Exam: Review of Systems  There were no vitals taken for this visit.There is no height or weight on file to calculate BMI.  General Appearance: {Appearance:22683}  Eye Contact:  {BHH EYE CONTACT:22684}  Speech:  Clear and Coherent  Volume:  Normal  Mood:  {BHH MOOD:22306}  Affect:  {Affect (PAA):22687}  Thought Process:  Coherent  Orientation:  Full (Time, Place, and Person)   Thought Content: Logical   Suicidal Thoughts:  {ST/HT (PAA):22692}  Homicidal Thoughts:  {ST/HT (PAA):22692}  Memory:  Immediate;   Good  Judgement:  {Judgement (PAA):22694}  Insight:  {Insight (PAA):22695}  Psychomotor Activity:  Normal  Concentration:  Concentration: Good and Attention Span: Good  Recall:  Good  Fund of Knowledge: Good  Language: Good  Akathisia:  No  Handed:  Right  AIMS (if indicated): not done  Assets:  Communication Skills Desire for Improvement  ADL's:  Intact  Cognition: WNL  Sleep:  {  BHH GOOD/FAIR/POOR:22877}   Screenings: GAD-7    Flowsheet Row Office Visit from 07/28/2022 in [facility]  Total GAD-7 Score 0   PHQ2-9    Flowsheet Row Office Visit from 07/28/2022 in [facility] Office Visit from 05/16/2022 in [facility]  PHQ-2 Total Score 0 0   Flowsheet Row ED from 01/14/2024 in [facility] UC from 01/13/2024 in [facility]  ED from 06/24/2023 in [facility] Emergency Department at [facility]  C-SSRS RISK CATEGORY No Risk No Risk No Risk     Assessment and Plan:  Calvin Bates is a 73 y.o. year old male with a history of psychosis,  tardive dyskinesia, CVA, diabetes, hyperlipidemia, hypertension, who presents for follow up appointment for below.    1. Schizophrenia, unspecified type (HCC) History:according to the caregiver at family home, he has been stable for ten years on olanzapine . Dose has been tapered down. Had paranoia when he missed to take it for 2 days in the past  He continues to demonstrate a linear thought process, and he is calm during the visit, which has been consistent since the last visit.  The staff denies any behavior concern except he has nocturia.  Will continue current dose of olanzapine  to target schizophrenia. Discussed with the  patient about the increased risk of CVA from olanzapine  for people with dementia.  He was previously advised to be back on aspirin  after discussion with PCP given his prior history of stroke.    2. Tardive dyskinesia The exam is notable for lipsmacking.  He is not interested in VMAT 2 inhibitor, and denies any concern about this.SABRA He will not be a good candidate to discontinue olanzapine  due to relapse in his symptoms as above.  Will continue to assess this.    # high risk medication use He is due for check metabolic panels.  The caregiver agrees to contact primary care to send the results to our office.        Last checked  EKG HR 86 , QTc  12/2023  Lipid panels Chol 108, LDL 34, wnl 11/2023  HbA1c 6.5 H 11/2023    CBC- Hm 11.1, Ht 33.7 BUN 24, Creat 1.42 (eGFR 52) AST/ALT- wnl     # right leg weakness Significant worsening.  Exam is notable for gait disturbances with right leg weakness.  According to the caregiver and the patient, vascular imaging of the leg was performed, and he was discharged from PT (and it worsened since then).  The caregiver agrees to contact PT again to see if he qualifies for the physical therapy again.  Noted that he has primary care visit every 4 weeks at the facility, and they are aware of his current condition.    Plan Continue olanzapine  2.5 mg at night  Next appointment: 10/13 at 11:30, IP   The patient demonstrates the following risk factors for suicide: Chronic risk factors for suicide include: psychiatric disorder of schizophrenia . Acute risk factors for suicide include: unemployment. Protective factors for this patient include: positive social support and hope for the future. Considering these factors, the overall suicide risk at this point appears to be low. Patient is appropriate for outpatient follow up.   Collaboration of Care: Collaboration of Care: {BH OP Collaboration of Care:21014065}  Patient/Guardian was advised Release of Information  must be obtained prior to any record release in  order to collaborate their care with an outside provider. Patient/Guardian was advised if they have not already done so to contact the registration department to sign all necessary forms in order for us  to release information regarding their care.   Consent: Patient/Guardian gives verbal consent for treatment and assignment of benefits for services provided during this visit. Patient/Guardian expressed understanding and agreed to proceed.    Katheren Sleet, MD 03/21/2024, 2:14 PM

## 2024-03-22 ENCOUNTER — Encounter: Payer: Self-pay | Admitting: Emergency Medicine

## 2024-03-22 ENCOUNTER — Emergency Department

## 2024-03-22 ENCOUNTER — Inpatient Hospital Stay

## 2024-03-22 ENCOUNTER — Other Ambulatory Visit: Payer: Self-pay

## 2024-03-22 ENCOUNTER — Inpatient Hospital Stay
Admission: EM | Admit: 2024-03-22 | Discharge: 2024-03-28 | DRG: 522 | Disposition: A | Discharge status: Skilled Nursing Facility | Source: Ambulatory Visit | Attending: Hospitalist | Admitting: Hospitalist

## 2024-03-22 DIAGNOSIS — Z7984 Long term (current) use of oral hypoglycemic drugs: Secondary | ICD-10-CM | POA: Diagnosis not present

## 2024-03-22 DIAGNOSIS — E785 Hyperlipidemia, unspecified: Secondary | ICD-10-CM | POA: Diagnosis present

## 2024-03-22 DIAGNOSIS — W19XXXA Unspecified fall, initial encounter: Secondary | ICD-10-CM | POA: Diagnosis present

## 2024-03-22 DIAGNOSIS — E440 Moderate protein-calorie malnutrition: Secondary | ICD-10-CM | POA: Diagnosis present

## 2024-03-22 DIAGNOSIS — G2401 Drug induced subacute dyskinesia: Secondary | ICD-10-CM | POA: Diagnosis present

## 2024-03-22 DIAGNOSIS — N179 Acute kidney failure, unspecified: Secondary | ICD-10-CM | POA: Diagnosis present

## 2024-03-22 DIAGNOSIS — I129 Hypertensive chronic kidney disease with stage 1 through stage 4 chronic kidney disease, or unspecified chronic kidney disease: Secondary | ICD-10-CM | POA: Diagnosis present

## 2024-03-22 DIAGNOSIS — Z8249 Family history of ischemic heart disease and other diseases of the circulatory system: Secondary | ICD-10-CM | POA: Diagnosis not present

## 2024-03-22 DIAGNOSIS — S72022A Displaced fracture of epiphysis (separation) (upper) of left femur, initial encounter for closed fracture: Secondary | ICD-10-CM

## 2024-03-22 DIAGNOSIS — Z7982 Long term (current) use of aspirin: Secondary | ICD-10-CM

## 2024-03-22 DIAGNOSIS — R296 Repeated falls: Secondary | ICD-10-CM | POA: Diagnosis present

## 2024-03-22 DIAGNOSIS — Y92199 Unspecified place in other specified residential institution as the place of occurrence of the external cause: Secondary | ICD-10-CM | POA: Diagnosis not present

## 2024-03-22 DIAGNOSIS — Z8673 Personal history of transient ischemic attack (TIA), and cerebral infarction without residual deficits: Secondary | ICD-10-CM

## 2024-03-22 DIAGNOSIS — E1122 Type 2 diabetes mellitus with diabetic chronic kidney disease: Secondary | ICD-10-CM | POA: Diagnosis present

## 2024-03-22 DIAGNOSIS — N1831 Chronic kidney disease, stage 3a: Secondary | ICD-10-CM | POA: Diagnosis present

## 2024-03-22 DIAGNOSIS — Z6821 Body mass index (BMI) 21.0-21.9, adult: Secondary | ICD-10-CM

## 2024-03-22 DIAGNOSIS — E11319 Type 2 diabetes mellitus with unspecified diabetic retinopathy without macular edema: Secondary | ICD-10-CM | POA: Diagnosis present

## 2024-03-22 DIAGNOSIS — Z833 Family history of diabetes mellitus: Secondary | ICD-10-CM

## 2024-03-22 DIAGNOSIS — S7292XA Unspecified fracture of left femur, initial encounter for closed fracture: Secondary | ICD-10-CM | POA: Diagnosis present

## 2024-03-22 DIAGNOSIS — J302 Other seasonal allergic rhinitis: Secondary | ICD-10-CM | POA: Diagnosis present

## 2024-03-22 DIAGNOSIS — F209 Schizophrenia, unspecified: Secondary | ICD-10-CM

## 2024-03-22 DIAGNOSIS — M7989 Other specified soft tissue disorders: Secondary | ICD-10-CM | POA: Diagnosis present

## 2024-03-22 DIAGNOSIS — S72002A Fracture of unspecified part of neck of left femur, initial encounter for closed fracture: Principal | ICD-10-CM | POA: Diagnosis present

## 2024-03-22 DIAGNOSIS — M79604 Pain in right leg: Secondary | ICD-10-CM

## 2024-03-22 DIAGNOSIS — Z79899 Other long term (current) drug therapy: Secondary | ICD-10-CM

## 2024-03-22 DIAGNOSIS — F259 Schizoaffective disorder, unspecified: Secondary | ICD-10-CM | POA: Diagnosis present

## 2024-03-22 DIAGNOSIS — I10 Essential (primary) hypertension: Secondary | ICD-10-CM

## 2024-03-22 DIAGNOSIS — E119 Type 2 diabetes mellitus without complications: Secondary | ICD-10-CM

## 2024-03-22 DIAGNOSIS — S72092A Other fracture of head and neck of left femur, initial encounter for closed fracture: Secondary | ICD-10-CM | POA: Diagnosis not present

## 2024-03-22 DIAGNOSIS — Y9301 Activity, walking, marching and hiking: Secondary | ICD-10-CM | POA: Diagnosis present

## 2024-03-22 LAB — CBC WITH DIFFERENTIAL/PLATELET
Abs Immature Granulocytes: 0.05 K/uL (ref 0.00–0.07)
Basophils Absolute: 0 K/uL (ref 0.0–0.1)
Basophils Relative: 0 %
Eosinophils Absolute: 0 K/uL (ref 0.0–0.5)
Eosinophils Relative: 0 %
HCT: 35 % — ABNORMAL LOW (ref 39.0–52.0)
Hemoglobin: 11.5 g/dL — ABNORMAL LOW (ref 13.0–17.0)
Immature Granulocytes: 0 %
Lymphocytes Relative: 4 %
Lymphs Abs: 0.6 K/uL — ABNORMAL LOW (ref 0.7–4.0)
MCH: 29.6 pg (ref 26.0–34.0)
MCHC: 32.9 g/dL (ref 30.0–36.0)
MCV: 90 fL (ref 80.0–100.0)
Monocytes Absolute: 1 K/uL (ref 0.1–1.0)
Monocytes Relative: 7 %
Neutro Abs: 12.8 K/uL — ABNORMAL HIGH (ref 1.7–7.7)
Neutrophils Relative %: 89 %
Platelets: 207 K/uL (ref 150–400)
RBC: 3.89 MIL/uL — ABNORMAL LOW (ref 4.22–5.81)
RDW: 13.2 % (ref 11.5–15.5)
WBC: 14.4 K/uL — ABNORMAL HIGH (ref 4.0–10.5)
nRBC: 0 % (ref 0.0–0.2)

## 2024-03-22 LAB — COMPREHENSIVE METABOLIC PANEL WITH GFR
ALT: 28 U/L (ref 0–44)
AST: 33 U/L (ref 15–41)
Albumin: 3.5 g/dL (ref 3.5–5.0)
Alkaline Phosphatase: 59 U/L (ref 38–126)
Anion gap: 11 (ref 5–15)
BUN: 34 mg/dL — ABNORMAL HIGH (ref 8–23)
CO2: 24 mmol/L (ref 22–32)
Calcium: 9.1 mg/dL (ref 8.9–10.3)
Chloride: 104 mmol/L (ref 98–111)
Creatinine, Ser: 1.34 mg/dL — ABNORMAL HIGH (ref 0.61–1.24)
GFR, Estimated: 56 mL/min — ABNORMAL LOW (ref >60)
Glucose, Bld: 137 mg/dL — ABNORMAL HIGH (ref 70–99)
Potassium: 3.7 mmol/L (ref 3.5–5.1)
Sodium: 139 mmol/L (ref 135–145)
Total Bilirubin: 0.8 mg/dL (ref 0.0–1.2)
Total Protein: 6.1 g/dL — ABNORMAL LOW (ref 6.5–8.1)

## 2024-03-22 LAB — GLUCOSE, CAPILLARY: Glucose-Capillary: 191 mg/dL — ABNORMAL HIGH (ref 70–99)

## 2024-03-22 MED ORDER — MORPHINE SULFATE (PF) 4 MG/ML IV SOLN
4.0000 mg | Freq: Once | INTRAVENOUS | Status: AC
  Administered 2024-03-22: 4 mg via INTRAVENOUS
  Filled 2024-03-22: qty 1

## 2024-03-22 MED ORDER — INSULIN ASPART 100 UNIT/ML IJ SOLN
0.0000 [IU] | Freq: Every day | INTRAMUSCULAR | Status: DC
  Administered 2024-03-23: 4 [IU] via SUBCUTANEOUS
  Administered 2024-03-26: 3 [IU] via SUBCUTANEOUS
  Administered 2024-03-27: 2 [IU] via SUBCUTANEOUS
  Filled 2024-03-22 (×3): qty 1

## 2024-03-22 MED ORDER — ADULT MULTIVITAMIN W/MINERALS CH: 1.0000 | ORAL_TABLET | Freq: Every day | ORAL | Status: DC

## 2024-03-22 MED ORDER — HYDRALAZINE HCL 20 MG/ML IJ SOLN: 5.0000 mg | Freq: Four times a day (QID) | INTRAMUSCULAR | Status: AC | PRN

## 2024-03-22 MED ORDER — INSULIN ASPART 100 UNIT/ML IJ SOLN
0.0000 [IU] | Freq: Three times a day (TID) | INTRAMUSCULAR | Status: DC
  Administered 2024-03-23: 2 [IU] via SUBCUTANEOUS
  Administered 2024-03-23: 1 [IU] via SUBCUTANEOUS
  Administered 2024-03-24: 2 [IU] via SUBCUTANEOUS
  Administered 2024-03-24: 3 [IU] via SUBCUTANEOUS
  Administered 2024-03-24: 2 [IU] via SUBCUTANEOUS
  Administered 2024-03-25 (×2): 1 [IU] via SUBCUTANEOUS
  Administered 2024-03-25: 3 [IU] via SUBCUTANEOUS
  Administered 2024-03-26: 1 [IU] via SUBCUTANEOUS
  Administered 2024-03-26: 2 [IU] via SUBCUTANEOUS
  Administered 2024-03-26: 5 [IU] via SUBCUTANEOUS
  Administered 2024-03-27: 2 [IU] via SUBCUTANEOUS
  Administered 2024-03-27: 5 [IU] via SUBCUTANEOUS
  Administered 2024-03-27 – 2024-03-28 (×3): 2 [IU] via SUBCUTANEOUS
  Administered 2024-03-28: 3 [IU] via SUBCUTANEOUS
  Filled 2024-03-22 (×18): qty 1

## 2024-03-22 MED ORDER — HYDROCODONE-ACETAMINOPHEN 5-325 MG PO TABS: 1.0000 | ORAL_TABLET | Freq: Four times a day (QID) | ORAL | Status: AC | PRN

## 2024-03-22 MED ORDER — ALBUTEROL SULFATE (2.5 MG/3ML) 0.083% IN NEBU: 2.5000 mg | INHALATION_SOLUTION | Freq: Four times a day (QID) | RESPIRATORY_TRACT | Status: AC | PRN

## 2024-03-22 MED ORDER — MORPHINE SULFATE (PF) 2 MG/ML IV SOLN: 1.0000 mg | INTRAVENOUS | Status: AC | PRN

## 2024-03-22 MED ORDER — METOPROLOL TARTRATE 25 MG PO TABS
25.0000 mg | ORAL_TABLET | Freq: Two times a day (BID) | ORAL | Status: DC
  Administered 2024-03-22 – 2024-03-28 (×12): 25 mg via ORAL
  Filled 2024-03-22 (×12): qty 1

## 2024-03-22 MED ORDER — THIAMINE HCL 100 MG PO TABS
100.0000 mg | ORAL_TABLET | Freq: Every day | ORAL | Status: DC
  Administered 2024-03-24 – 2024-03-28 (×5): 100 mg via ORAL
  Filled 2024-03-22 (×11): qty 1

## 2024-03-22 MED ORDER — DOCUSATE SODIUM 100 MG PO CAPS
100.0000 mg | ORAL_CAPSULE | Freq: Two times a day (BID) | ORAL | Status: DC
  Administered 2024-03-22 – 2024-03-24 (×3): 100 mg via ORAL
  Filled 2024-03-22 (×3): qty 1

## 2024-03-22 MED ORDER — ATORVASTATIN CALCIUM 20 MG PO TABS
20.0000 mg | ORAL_TABLET | Freq: Every day | ORAL | Status: DC
  Administered 2024-03-22 – 2024-03-27 (×6): 20 mg via ORAL
  Filled 2024-03-22 (×6): qty 1

## 2024-03-22 MED ORDER — ACETAMINOPHEN 325 MG PO TABS
650.0000 mg | ORAL_TABLET | Freq: Four times a day (QID) | ORAL | Status: AC | PRN
  Administered 2024-03-26: 650 mg via ORAL
  Filled 2024-03-22 (×2): qty 2

## 2024-03-22 MED ORDER — FLUTICASONE PROPIONATE 50 MCG/ACT NA SUSP
1.0000 | Freq: Every evening | NASAL | Status: DC | PRN
  Filled 2024-03-22: qty 16

## 2024-03-22 MED ORDER — AMLODIPINE BESYLATE 10 MG PO TABS
10.0000 mg | ORAL_TABLET | Freq: Every day | ORAL | Status: DC
  Administered 2024-03-24 – 2024-03-28 (×5): 10 mg via ORAL
  Filled 2024-03-22 (×5): qty 1

## 2024-03-22 MED ORDER — GUAIFENESIN 100 MG/5ML PO LIQD: 200.0000 mg | Freq: Three times a day (TID) | ORAL | Status: DC | PRN

## 2024-03-22 MED ORDER — OLANZAPINE 5 MG PO TABS
2.5000 mg | ORAL_TABLET | Freq: Every day | ORAL | Status: DC
  Administered 2024-03-22 – 2024-03-27 (×6): 2.5 mg via ORAL
  Filled 2024-03-22 (×6): qty 1

## 2024-03-22 MED ORDER — HYDRALAZINE HCL 25 MG PO TABS
25.0000 mg | ORAL_TABLET | Freq: Two times a day (BID) | ORAL | Status: DC
  Administered 2024-03-22 – 2024-03-28 (×11): 25 mg via ORAL
  Filled 2024-03-22 (×11): qty 1

## 2024-03-22 MED ORDER — LISINOPRIL 20 MG PO TABS
40.0000 mg | ORAL_TABLET | Freq: Every day | ORAL | Status: DC
  Administered 2024-03-24 – 2024-03-28 (×5): 40 mg via ORAL
  Filled 2024-03-22 (×5): qty 2

## 2024-03-22 NOTE — ED Notes (Signed)
 IV pain meds offered   Pt refused at  present  States as long as he is still is not having pain

## 2024-03-22 NOTE — Assessment & Plan Note (Addendum)
 With tardive dyskinesia of the tongue Home olanzapine  2.5 mg nightly resumed

## 2024-03-22 NOTE — ED Provider Notes (Signed)
 [facility] Provider Note    Event Date/Time   First MD Initiated Contact with Patient 03/22/24 1124     (approximate)   History   Chief Complaint Fall   HPI  Calvin Bates is a 73 y.o. male with past medical history of hypertension, hyperlipidemia, diabetes, and stroke who presents to the ED complaining of fall.  Patient reports that he was using his walker at his group home when his legs gave out on and he fell to the ground, striking his left hip.  He denies hitting his head or losing consciousness, does not take a blood thinner.  He complains of pain at his left hip, especially with any movement.  He denies any pain to his right hip, knees, or ankles.  He also denies any pain to his chest, abdomen, or upper extremities.     Physical Exam   Triage Vital Signs: ED Triage Vitals  Encounter Vitals Group     BP      Girls Systolic BP Percentile      Girls Diastolic BP Percentile      Boys Systolic BP Percentile      Boys Diastolic BP Percentile      Pulse      Resp      Temp      Temp src      SpO2      Weight      Height      Head Circumference      Peak Flow      Pain Score      Pain Loc      Pain Education      Exclude from Growth Chart     Most recent vital signs: Vitals:   03/22/24 1128 03/22/24 1332  BP: (!) 171/61 (!) 168/68  Pulse: 64 64  Resp: 17 16  Temp: (!) 97.5 F (36.4 C)   SpO2: 96% 95%    Constitutional: Alert and oriented. Eyes: Conjunctivae are normal. Head: Atraumatic. Nose: No congestion/rhinnorhea. Mouth/Throat: Mucous membranes are moist.  Neck: No midline cervical spine tenderness to palpation. Cardiovascular: Normal rate, regular rhythm. Grossly normal heart sounds.  2+ radial and DP pulses bilaterally. Respiratory: Normal respiratory effort.  No retractions. Lungs CTAB.  No chest wall tenderness to palpation. Gastrointestinal: Soft and nontender. No distention. Musculoskeletal: Diffuse tenderness to  palpation at the left hip with no obvious deformity.  No tenderness at right hip, bilateral knees, or bilateral ankles.  No upper extremity bony tenderness to palpation, small skin tear to right elbow. Neurologic:  Normal speech and language. No gross focal neurologic deficits are appreciated.    ED Results / Procedures / Treatments   Labs (all labs ordered are listed, but only abnormal results are displayed) Labs Reviewed  CBC WITH DIFFERENTIAL/PLATELET - Abnormal; Notable for the following components:      Result Value   WBC 14.4 (*)    RBC 3.89 (*)    Hemoglobin 11.5 (*)    HCT 35.0 (*)    Neutro Abs 12.8 (*)    Lymphs Abs 0.6 (*)    All other components within normal limits  COMPREHENSIVE METABOLIC PANEL WITH GFR - Abnormal; Notable for the following components:   Glucose, Bld 137 (*)    BUN 34 (*)    Creatinine, Ser 1.34 (*)    Total Protein 6.1 (*)    GFR, Estimated 56 (*)    All other components within normal limits  EKG  ED ECG REPORT I, Carlin Palin, the attending physician, personally viewed and interpreted this ECG.   Date: 03/22/2024  EKG Time: 13:02  Rate: 61  Rhythm: normal sinus rhythm  Axis: Normal  Intervals:none  ST&T Change: None  RADIOLOGY Left hip x-ray reviewed and interpreted by me with femoral neck fracture.  PROCEDURES:  Critical Care performed: No  Procedures   MEDICATIONS ORDERED IN ED: Medications  morphine  (PF) 4 MG/ML injection 4 mg (has no administration in time range)     IMPRESSION / MDM / ASSESSMENT AND PLAN / ED COURSE  I reviewed the triage vital signs and the nursing notes.                              73 y.o. male with past medical history of hypertension, hyperlipidemia, diabetes, and stroke who presents to the ED after his legs gave out on him, causing him to fall and strike his left hip.  Patient's presentation is most consistent with acute presentation with potential threat to life or bodily  function.  Differential diagnosis includes, but is not limited to, contusion, fracture, dislocation, neurovascular injury.  Patient nontoxic-appearing and in no acute distress, vital signs are unremarkable.  He has diffuse tenderness to palpation of his left hip with no obvious deformity, is neurovascularly intact distally.  No evidence of traumatic injury to his head, neck, trunk, or upper extremities.  We will further assess with x-ray, patient declines pain medication.  Hip x-ray concerning for femoral neck fracture.  Case discussed with Dr. Maryrose of orthopedics, who will potentially plan for orthopedic intervention later today and we will keep patient NPO.  Labs show stable renal function with no acute electrolyte abnormality or significant anemia, mild leukocytosis noted but no symptoms to suggest infection.  Case discussed with hospitalist for admission.      FINAL CLINICAL IMPRESSION(S) / ED DIAGNOSES   Final diagnoses:  Fall, initial encounter  Closed fracture of left hip, initial encounter [facility])     Rx / DC Orders   ED Discharge Orders     None        Note:  This document was prepared using Dragon voice recognition software and may include unintentional dictation errors.   Palin Carlin, MD 03/22/24 1344

## 2024-03-22 NOTE — Assessment & Plan Note (Signed)
-   Atorvastatin 20 mg nightly resumed 

## 2024-03-22 NOTE — Assessment & Plan Note (Addendum)
Home atorvastatin 20 mg nightly resumed

## 2024-03-22 NOTE — H&P (Addendum)
 History and Physical   Calvin Bates FMW:[id number] DOB: 1950-11-15 DOA: 03/22/2024  PCP: Calvin Bates LABOR, MD  Outpatient Specialists: Dr. Vickey, behavioral health Patient coming from: [facility] via EMS  I have personally briefly reviewed patient's old medical records in [facility] EMR.  Chief Concern: Leg gives out, left leg pain  HPI: Mr. Calvin Bates is a 73 year old male with history of hypertension, hyperlipidemia, history of stroke, non-insulin -dependent diabetes mellitus, schizophrenia, with history of tardive dyskinesia, who presents ED for chief concerns of leg giving out and landing on his left side.  Vitals in the ED showed t 97.5, rr 17, hr 65, blood pressure 171/61, SpO2 of 96% on room air.  Serum sodium is 139, potassium 3.7, chloride 104, bicarb 24, BUN of 34, serum creatinine 1.34, eGFR 56, nonfasting blood glucose 137, WBC 14.4, hemoglobin 11.5, platelets of 277.  AST/ALT were within normal limits  ED treatment: Morphine  4 mg IV one-time dose.  Orthopedic service was consulted and states patient will need to be n.p.o. pending evaluation for OR today. ------------------------------------ At bedside, patient able to tell me his first and last name, age, location, current calendar year.  He reports that earlier this morning he was walking to the restroom when he felt his leg gave out on him and he fell on his left side.    He denies head trauma, loss of consciousness, chest pain, abdominal pain, dysuria, hematuria, shortness of breath, nausea, vomiting, fever, chills.  He endorses decreased range of motion of the left lower extremity.  On exam, it was noted that he had right calf swelling and patient was not able to tell me how long this has been going on, but he states this may be a new thing.  There is also right calf tenderness with palpation.  Social history: He lives in a group home.  He denies tobacco, EtOH, recreational drug use.  He is  retired.  ROS: Constitutional: no weight change, no fever ENT/Mouth: no sore throat, no rhinorrhea Eyes: no eye pain, no vision changes Cardiovascular: no chest pain, no dyspnea,  no edema, no palpitations Respiratory: no cough, no sputum, no wheezing Gastrointestinal: no nausea, no vomiting, no diarrhea, no constipation Genitourinary: no urinary incontinence, no dysuria, no hematuria Musculoskeletal: no arthralgias, no myalgias, + decreased range of motion of the left lower extremity. Skin: no skin lesions, no pruritus, Neuro: + weakness, no loss of consciousness, no syncope Psych: no anxiety, no depression, + decrease appetite Heme/Lymph: no bruising, no bleeding  ED Course: Discussed with EDP, patient requiring hospitalization for chief concerns of left femur fracture.  Assessment/Plan  Principal Problem:   Closed left femoral fracture (HCC) Active Problems:   Diabetes mellitus type 2, noninsulin dependent (HCC)   Essential hypertension   History of stroke   Pain and swelling of right lower extremity   Hyperlipidemia   Schizophrenia (HCC)   Assessment and Plan:  * Closed left femoral fracture [facility]) Orthopedic service has been consulted and states patient will need to be n.p.o. pending evaluation for OR on day of admission N.p.o. Symptomatic support: Acetaminophen  650 p.o. every 6 hours as needed for mild pain, 5 days ordered; hydrocodone -acetaminophen  5-325 mg, 1 tablet every 6 hours as needed for moderate pain, 1 day ordered; morphine  1 mg IV every 2 hours as needed for severe pain, 1 day ordered PT, OT, TOC consulted Telemetry medical, inpatient  Pain and swelling of right lower extremity Ultrasound of the right lower extremity to assess for  DVT ordered on admission  History of stroke Atorvastatin  20 mg nightly resumed  Essential hypertension Home hydralazine  25 mg p.o. twice daily resumed Home amlodipine  10 mg daily, clonidine 0.1 mg, lisinopril  40 mg daily,  metoprolol  tartrate 25 mg twice daily were resumed Hydralazine  5 mg IV every 6 hours as needed for SBP greater 170, 5 days ordered  Diabetes mellitus type 2, noninsulin dependent (HCC) Home metformin 500 mg daily will not be resumed on admission Insulin  SSI with at bedtime coverage ordered Goal inpatient blood glucose levels 140-180  Schizophrenia (HCC) With tardive dyskinesia of the tongue Home olanzapine  2.5 mg nightly resumed  Hyperlipidemia Home atorvastatin  20 mg nightly resumed  Chart reviewed.   DVT prophylaxis: Pharmacologic DVT not initiated on admission.  AM team to initiate pharmacologic DVT when the benefits outweigh the risk.  Code Status: Full code Diet: Modified diet; n.p.o. after midnight in anticipation of OR with orthopedic at 8:30 in the morning Family Communication: No Disposition Plan: Doing clinical course Consults called: Orthopedic service Admission status: Telemetry surgical, inpatient  Past Medical History:  Diagnosis Date   Diabetes mellitus without complication (HCC)    Diabetic retinopathy (HCC)    Hyperlipemia    Hypertension    Neuropathy    Psychosis (HCC)    Stroke [facility])    Past Surgical History:  Procedure Laterality Date   BACK SURGERY     Social History:  reports that he has never smoked. He has never used smokeless tobacco. He reports that he does not drink alcohol and does not use drugs.  Allergies  Allergen Reactions   Seasonal Ic [Octacosanol]    Family History  Problem Relation Age of Onset   Diabetes Mother    Diabetes Father    Hypertension Brother    Family history: Family history reviewed and not pertinent.  Prior to Admission medications   Medication Sig Start Date End Date Taking? Authorizing Provider  acetaminophen  (TYLENOL ) 325 MG tablet Take 650 mg by mouth every 6 (six) hours as needed.    [provider]  albuterol  (PROVENTIL  HFA;VENTOLIN  HFA) 108 (90 Base) MCG/ACT inhaler Inhale 2 puffs into the  lungs every 6 (six) hours as needed for wheezing or shortness of breath. Patient not taking: Reported on 01/13/2024 09/30/17   Bates, Graydon, MD  amLODipine  (NORVASC ) 10 MG tablet Take 10 mg by mouth daily. 01/05/21   [provider]  Ascorbic Acid (VITAMIN C) 1000 MG tablet Take 250 mg by mouth daily. Patient not taking: Reported on 01/13/2024    [provider]  atorvastatin  (LIPITOR) 40 MG tablet Take 20 mg by mouth daily. 01/05/21   [provider]  cholecalciferol (VITAMIN D3) 25 MCG (1000 UNIT) tablet Take 1,000 Units by mouth daily.    [provider]  cloNIDine (CATAPRES) 0.1 MG tablet Take by mouth. 07/31/23   [provider]  co-enzyme Q-10 30 MG capsule Take by mouth daily.    [provider]  fluticasone  (FLONASE ) 50 MCG/ACT nasal spray Place into both nostrils at bedtime. 02/12/21   [provider]  gatifloxacin (ZYMAXID) 0.5 % SOLN Place 1 drop into the right eye 4 (four) times daily. 01/01/24   [provider]  guaifenesin  (ROBITUSSIN) 100 MG/5ML syrup Take 200 mg by mouth 3 (three) times daily as needed for cough.    [provider]  hydrALAZINE  (APRESOLINE ) 25 MG tablet Take 25 mg by mouth 2 (two) times daily. 01/08/24   [provider]  ketorolac CLAIR)  0.5 % ophthalmic solution Place 1 drop into the right eye 4 (four) times daily. 01/01/24   [provider]  levOCARNitine (L-CARNITINE) 500 MG TABS Take 1 tablet by mouth daily. 07/31/23   [provider]  lisinopril  (PRINIVIL ,ZESTRIL ) 40 MG tablet Take 40 mg by mouth daily. 11/03/14   [provider]  loperamide (IMODIUM) 2 MG capsule Take by mouth as needed for diarrhea or loose stools.    [provider]  loperamide (IMODIUM) 2 MG capsule Take 2 mg by mouth.    [provider]  magnesium hydroxide (MILK OF MAGNESIA) 400 MG/5ML suspension Take by mouth daily as needed for mild constipation.    [provider]  magnesium oxide (MAG-OX) 400 MG tablet Take 1 tablet by mouth 2 (two) times daily. 10/16/20   [provider]  metFORMIN (GLUCOPHAGE) 500 MG tablet Take 500 mg by mouth 2 (two) times daily with a meal. 11/03/14   [provider]  metoprolol  tartrate (LOPRESSOR ) 25 MG tablet Take 25 mg by mouth 2 (two) times daily. 11/03/14   [provider]  Multiple Vitamin (MULTIVITAMIN) capsule Take 1 capsule by mouth daily.    [provider]  Multiple Vitamins-Minerals (CENTRUM SILVER) tablet Take 1 tablet by mouth daily.    [provider]  OLANZapine  (ZYPREXA ) 2.5 MG tablet Take 1 tablet (2.5 mg total) by mouth at bedtime. 07/26/23 12/28/23  Calvin Bates Mettle, MD  OLANZapine  (ZYPREXA ) 2.5 MG tablet Take 1 tablet (2.5 mg total) by mouth at bedtime. 12/23/23 03/22/24  Calvin Bates Mettle, MD  Omega-3 Fatty Acids (FISH OIL) 1000 MG CAPS Take 1 capsule by mouth 2 (two) times daily. 08/31/20   [provider]  prednisoLONE acetate (PRED FORTE) 1 % ophthalmic suspension Place 1 drop into the right eye 4 (four) times daily. 01/01/24   [provider]  tamsulosin (FLOMAX) 0.4 MG CAPS capsule Take 0.4 mg by mouth daily.    [provider]  thiamine  (VITAMIN B-1) 100 MG tablet Take 100 mg by mouth daily.    [provider]  thiamine  100 MG tablet Take 100 mg by mouth daily.    [provider]  Wheat Dextrin (BENEFIBER) POWD Take 3 g by mouth in the morning, at noon, and at bedtime.    [provider]   Physical Exam: Vitals:   03/22/24 1128 03/22/24 1129 03/22/24 1332  BP: (!) 171/61  (!) 168/68  Pulse: 64  64  Resp: 17  16  Temp: (!) 97.5 F (36.4 C)    TempSrc: Oral    SpO2: 96%  95%  Weight:  65 kg   Height:  5' 9 (1.753 m)    Constitutional: appears frail, age-appropriate, NAD, calm Eyes: PERRL, lids and conjunctivae normal ENMT: Mucous membranes are moist. Posterior pharynx clear of any exudate or lesions.  Age-appropriate dentition. Hearing appropriate Neck: normal, supple, no masses, no thyromegaly Respiratory: clear to auscultation bilaterally, no wheezing, no crackles. Normal respiratory effort. No accessory muscle use.  Cardiovascular: Regular rate and rhythm, no murmurs / rubs / gallops.  Right lower extremity edema and tenderness. 2+ pedal pulses. No carotid bruits.  Abdomen: no tenderness, no masses palpated, no hepatosplenomegaly. Bowel sounds positive.  Musculoskeletal: no clubbing / cyanosis. No joint deformity upper and lower extremities.  Decreased ROM of the left lower extremity. No contractures, no atrophy. Normal muscle tone.  Skin: no rashes, lesions, ulcers. No induration Neurologic: Sensation intact. Strength 5/5 in all 4.  Psychiatric: Normal judgment  and insight. Alert and oriented x 3. Normal mood.   EKG: independently reviewed, showing sinus rhythm with rate of 61, QTc 374  Chest x-ray on Admission: I personally reviewed and I agree with radiologist reading as below.  DG Chest Portable 1 View Result Date: 03/22/2024 CLINICAL DATA:  Recent fall sent for medical clearance. EXAM: PORTABLE CHEST 1 VIEW COMPARISON:  June 24, 2023 FINDINGS: The heart size and mediastinal contours are within normal limits. Stable diffuse chronic appearing increased interstitial lung markings are seen. There is no evidence of focal consolidation, pleural effusion or pneumothorax. There are multiple chronic bilateral rib fractures. A chronic fracture of the right humeral head and neck is also noted. Multilevel degenerative changes are seen throughout the thoracic spine. IMPRESSION: Chronic appearing increased interstitial lung markings without evidence of acute or active cardiopulmonary disease. Electronically Signed   By: Suzen Dials M.D.   On: 03/22/2024 13:16   DG Hip Unilat W or Wo Pelvis 2-3 Views Left Result Date: 03/22/2024 CLINICAL DATA:  Status post fall. EXAM: DG HIP (WITH OR WITHOUT  PELVIS) 2-3V LEFT COMPARISON:  None Available. FINDINGS: An acute, impacted fracture deformity is seen involving the head and neck of the proximal left femur. A fracture deformity of indeterminate age is also seen involving the right inferior pubic ramus. There is no evidence of dislocation. Degenerative changes seen involving the left hip in the form of joint space narrowing and acetabular sclerosis. IMPRESSION: 1. Acute fracture of the proximal left femur. 2. Fracture of the right inferior pubic ramus of indeterminate age. Electronically Signed   By: Suzen Dials M.D.   On: 03/22/2024 12:17   Labs on Admission: I have personally reviewed following labs  CBC: Recent Labs  Lab 03/22/24 1232  WBC 14.4*  NEUTROABS 12.8*  HGB 11.5*  HCT 35.0*  MCV 90.0  PLT 207   Basic Metabolic Panel: Recent Labs  Lab 03/22/24 1232  NA 139  K 3.7  CL 104  CO2 24  GLUCOSE 137*  BUN 34*  CREATININE 1.34*  CALCIUM  9.1   GFR: Estimated Creatinine Clearance: 45.1 mL/min (A) (by C-G formula based on SCr of 1.34 mg/dL (H)).  Liver Function Tests: Recent Labs  Lab 03/22/24 1232  AST 33  ALT 28  ALKPHOS 59  BILITOT 0.8  PROT 6.1*  ALBUMIN 3.5   Urine analysis:    Component Value Date/Time   COLORURINE YELLOW (A) 01/14/2024 1715   APPEARANCEUR HAZY (A) 01/14/2024 1715   LABSPEC 1.014 01/14/2024 1715   PHURINE 5.0 01/14/2024 1715   GLUCOSEU >=500 (A) 01/14/2024 1715   HGBUR NEGATIVE 01/14/2024 1715   BILIRUBINUR NEGATIVE 01/14/2024 1715   BILIRUBINUR negative 01/13/2024 1104   KETONESUR NEGATIVE 01/14/2024 1715   PROTEINUR 100 (A) 01/14/2024 1715   UROBILINOGEN 0.2 01/13/2024 1104   NITRITE NEGATIVE 01/14/2024 1715   LEUKOCYTESUR NEGATIVE 01/14/2024 1715   This document was prepared using Dragon Voice Recognition software and may include unintentional dictation errors.  Dr. Sherre [facility]  If 7PM-7AM, please contact overnight-coverage provider If 7AM-7PM, please  contact day attending provider [email or web address]  03/22/2024, 3:18 PM

## 2024-03-22 NOTE — Hospital Course (Addendum)
 Mr. Calvin Bates is a 73 year old male with history of hypertension, hyperlipidemia, history of stroke, non-insulin -dependent diabetes mellitus, schizophrenia, with history of tardive dyskinesia, who presents ED for chief concerns of leg giving out and landing on his left side.  Vitals in the ED showed t 97.5, rr 17, hr 65, blood pressure 171/61, SpO2 of 96% on room air.  Serum sodium is 139, potassium 3.7, chloride 104, bicarb 24, BUN of 34, serum creatinine 1.34, eGFR 56, nonfasting blood glucose 137, WBC 14.4, hemoglobin 11.5, platelets of 277.  AST/ALT were within normal limits  ED treatment: Morphine  4 mg IV one-time dose.  Orthopedic service was consulted and states patient will need to be n.p.o. pending evaluation for OR today.

## 2024-03-22 NOTE — Assessment & Plan Note (Signed)
 Orthopedic service has been consulted and states patient will need to be n.p.o. pending evaluation for OR on day of admission N.p.o. Symptomatic support: Acetaminophen  650 p.o. every 6 hours as needed for mild pain, 5 days ordered; hydrocodone -acetaminophen  5-325 mg, 1 tablet every 6 hours as needed for moderate pain, 1 day ordered; morphine  1 mg IV every 2 hours as needed for severe pain, 1 day ordered PT, OT, TOC consulted Telemetry medical, inpatient

## 2024-03-22 NOTE — ED Notes (Signed)
Fall bundle in place

## 2024-03-22 NOTE — Assessment & Plan Note (Addendum)
 Home hydralazine  25 mg p.o. twice daily resumed Home amlodipine  10 mg daily, clonidine 0.1 mg, lisinopril  40 mg daily, metoprolol  tartrate 25 mg twice daily were resumed Hydralazine  5 mg IV every 6 hours as needed for SBP greater 170, 5 days ordered

## 2024-03-22 NOTE — Assessment & Plan Note (Addendum)
 Home metformin 500 mg daily will not be resumed on admission Insulin  SSI with at bedtime coverage ordered Goal inpatient blood glucose levels 140-180

## 2024-03-22 NOTE — ED Notes (Signed)
 Ortho MD in with pt    Consent signed  I spoke with the niece   And informed her of the pt's condition

## 2024-03-22 NOTE — Assessment & Plan Note (Signed)
 Ultrasound of the right lower extremity to assess for DVT ordered on admission

## 2024-03-22 NOTE — Consult Note (Signed)
 ORTHOPEDIC CONSULTATION  Calvin Bates [id number]  03/22/2024  CC:  Chief Complaint  Patient presents with   Fall    History of Present IlIness: The patient is a 73 y.o. male who lives in a group home secondary to chronic schizoaffective disorder.  He reports he has lived there for approximately 15 years.  He got up during the night to go to the bathroom using his walker but fell landing on his left hip.  He was unable to get up and laid in the floor reportedly until this morning when he was discovered and EMS was called.  He denies loss of consciousness.  He denies neck or back pain.  He reports he did not strike his head.  Further questioning reveals that he has had multiple falls in the last several years and does remember having a crack in his pelvis on the right side.  He is unsure of the date.  He does take a baby aspirin daily but denies any significant blood thinners as documented in his medication list.  PMH:  Past Medical History:  Diagnosis Date   Diabetes mellitus without complication (HCC)    Diabetic retinopathy (HCC)    Hyperlipemia    Hypertension    Neuropathy    Psychosis (HCC)    Stroke (HCC)     SH:  Past Surgical History:  Procedure Laterality Date   BACK SURGERY      ALL:  Allergies  Allergen Reactions   Seasonal Ic [Octacosanol]     MED: (Not in a hospital admission)   All home medications have been reviewed as documented in the medication reconciliation portion of the patient record.  FH: History reviewed. No pertinent family history.  Social:  reports that he has never smoked. He has never used smokeless tobacco. He reports that he does not drink alcohol and does not use drugs.  Review of Systems: General: Denies fever, chills, weight loss Eyes: Denies blurry vision, changes in vision ENT: Denies sore throat, congestions, nosebleeds CV: Denies chest pain, palpitations Respiratory: Denies shortness of breath, wheezing, cough Gl: Denies  abdominal pain, nausea, vomiting GU: Denies hematuria Integumentary: Denies rashes or lesions Neuro: Denies headache, dizziness Psych: Negative Hem/Onc: Denies easy bruising or bleeding disorders Musculoskeletal: See HPI above.  Vitals: BP (!) 168/68 (BP Location: Left Arm)   Pulse 64   Temp (!) 97.5 F (36.4 C) (Oral)   Resp 16   Ht 5' 9 (1.753 m)   Wt 65 kg   SpO2 95%   BMI 21.16 kg/m    Physical Exam: General: Awake, alert and oriented, no acute distress. Eyes: Pupils reactive, EOMI, normal conjunctiva, no scleral icterus. HENT: Normocephalic, atraumatic, normal hearing, moist oral mucosa Neck: Supple, non-tender, no cervical lymphadenopathy. Lungs: Chest rise is symmetric, non-labored respiration, chest wall nontender to palpation Heart: Normal rate by palpation, normal peripheral perfusion Abdomen: Soft, non-tender, non-distended. Pelvis is stable. Skin: Skin envelope intact, dry and pink, no rashes or lesions, no signs of infection. Neurologic: Awake, alert, and oriented X3 Psychiatric: Cooperative, appropriate mood and affect.  Musculoskeletal: Evaluation of the patient's bilateral upper extremities and right lower extremity reveal no areas of tenderness or limitations to range of motion. Any motion about the patient's left hip is extremely painful. Palpation of the left distal femur, knee, tib-fib region, ankle and foot show no areas of tenderness. The patient's calves are soft and nontender with no palpable cords. Homans testing is negative. Dorsalis pedis and posterior tibial pulse are 2+  and symmetric. The patient's toes are pink and warm with a brisk capillary refill time. The patient is able to gently move their ankles and toes on command. Sensation is intact over all lower extremity dermatomal patterns.   Radiographic findings: Radiographs of the patient's left hip and pelvis were evaluated on the PACS system.  The patient has an impacted angulated left femoral neck  fracture.  Minimal degenerative changes are noted in the acetabulum.  There is a chronic healed right inferior pubic rami fracture noted.   Labs:  Recent Labs    03/22/24 1232  HGB 11.5*   Recent Labs    03/22/24 1232  WBC 14.4*  RBC 3.89*  HCT 35.0*  PLT 207   Recent Labs    03/22/24 1232  NA 139  K 3.7  CL 104  CO2 24  BUN 34*  CREATININE 1.34*  GLUCOSE 137*  CALCIUM  9.1   No results for input(s): LABPT, INR in the last 72 hours.   Assessment/Plan:    Assessment: 73 year old male with diabetes and schizoaffective disorder with closed left impacted, angulated femoral neck fracture.   Plan: I discussed the operative and nonoperative treatment options with the patient.  To decrease pain and allow for early ambulation I would recommend a left bipolar hemiarthroplasty.  We discussed the #1 reason for replacing the broken left hip would be to decrease his pain and allow for return to ambulation.  He ambulates with a walker at baseline in the group home.  I discussed with the patient that I would anticipate placing him on the operative schedule for tomorrow morning 03/23/2024 after being evaluated and medically cleared and optimized by the hospitalist service.  We discussed being in the hospital approximately 2 to 3 days after surgery and he may be able to return to the group home with home health and home PT. We will hold his aspirin and reinstitute 325 mg of enteric-coated aspirin twice a day for 30 days after surgery for DVT prophylaxis.  The patient may have a diabetic diet this evening and will be n.p.o. after midnight.   The patient has been extensively counseled on the potential benefits versus risks of surgery including but not limited to postoperative nausea, wound healing issues, infection, blood clots, breakage or loosening of any metallic implants or anchors, limb length inequality, dislocation, and possible need for future revision surgery. We also discussed the  significant medical risks of stroke, myocardial infarction, anesthetic complications, or exacerbation of their diabetes mellitus. After discussing the multiple pros and cons, they would like to proceed with surgery as discussed. Their consent was reviewed and signed and placed in the chart.   Cordella Lamar Knack M.D. 03/22/2024 2:22 PM

## 2024-03-22 NOTE — ED Triage Notes (Signed)
 Presents via EMS from [facility]  States his leg gave out  He fell   Landed on left side  Having pain to left femur

## 2024-03-23 ENCOUNTER — Inpatient Hospital Stay

## 2024-03-23 ENCOUNTER — Encounter: Payer: Self-pay | Admitting: Internal Medicine

## 2024-03-23 ENCOUNTER — Other Ambulatory Visit: Payer: Self-pay

## 2024-03-23 ENCOUNTER — Encounter
Admission: EM | Disposition: A | Discharge status: Skilled Nursing Facility | Payer: Self-pay | Source: Ambulatory Visit | Attending: Internal Medicine

## 2024-03-23 ENCOUNTER — Inpatient Hospital Stay: Admitting: Anesthesiology

## 2024-03-23 DIAGNOSIS — S72022A Displaced fracture of epiphysis (separation) (upper) of left femur, initial encounter for closed fracture: Secondary | ICD-10-CM | POA: Diagnosis not present

## 2024-03-23 DIAGNOSIS — S72092A Other fracture of head and neck of left femur, initial encounter for closed fracture: Secondary | ICD-10-CM

## 2024-03-23 HISTORY — PX: HIP ARTHROPLASTY: SHX981

## 2024-03-23 LAB — BASIC METABOLIC PANEL WITH GFR
Anion gap: 8 (ref 5–15)
BUN: 36 mg/dL — ABNORMAL HIGH (ref 8–23)
CO2: 23 mmol/L (ref 22–32)
Calcium: 8.7 mg/dL — ABNORMAL LOW (ref 8.9–10.3)
Chloride: 106 mmol/L (ref 98–111)
Creatinine, Ser: 1.52 mg/dL — ABNORMAL HIGH (ref 0.61–1.24)
GFR, Estimated: 48 mL/min — ABNORMAL LOW (ref >60)
Glucose, Bld: 124 mg/dL — ABNORMAL HIGH (ref 70–99)
Potassium: 4 mmol/L (ref 3.5–5.1)
Sodium: 137 mmol/L (ref 135–145)

## 2024-03-23 LAB — CBC WITH DIFFERENTIAL/PLATELET
Abs Immature Granulocytes: 0.06 K/uL (ref 0.00–0.07)
Basophils Absolute: 0.1 K/uL (ref 0.0–0.1)
Basophils Relative: 0 %
Eosinophils Absolute: 0.3 K/uL (ref 0.0–0.5)
Eosinophils Relative: 3 %
HCT: 32.7 % — ABNORMAL LOW (ref 39.0–52.0)
Hemoglobin: 10.7 g/dL — ABNORMAL LOW (ref 13.0–17.0)
Immature Granulocytes: 1 %
Lymphocytes Relative: 6 %
Lymphs Abs: 0.7 K/uL (ref 0.7–4.0)
MCH: 29.3 pg (ref 26.0–34.0)
MCHC: 32.7 g/dL (ref 30.0–36.0)
MCV: 89.6 fL (ref 80.0–100.0)
Monocytes Absolute: 0.7 K/uL (ref 0.1–1.0)
Monocytes Relative: 6 %
Neutro Abs: 10 K/uL — ABNORMAL HIGH (ref 1.7–7.7)
Neutrophils Relative %: 84 %
Platelets: 184 K/uL (ref 150–400)
RBC: 3.65 MIL/uL — ABNORMAL LOW (ref 4.22–5.81)
RDW: 13.2 % (ref 11.5–15.5)
WBC: 11.7 K/uL — ABNORMAL HIGH (ref 4.0–10.5)
nRBC: 0 % (ref 0.0–0.2)

## 2024-03-23 LAB — PROTIME-INR
INR: 1.3 — ABNORMAL HIGH (ref 0.8–1.2)
Prothrombin Time: 16.6 s — ABNORMAL HIGH (ref 11.4–15.2)

## 2024-03-23 LAB — GLUCOSE, CAPILLARY
Glucose-Capillary: 147 mg/dL — ABNORMAL HIGH (ref 70–99)
Glucose-Capillary: 116 mg/dL — ABNORMAL HIGH (ref 70–99)
Glucose-Capillary: 174 mg/dL — ABNORMAL HIGH (ref 70–99)
Glucose-Capillary: 179 mg/dL — ABNORMAL HIGH (ref 70–99)
Glucose-Capillary: 315 mg/dL — ABNORMAL HIGH (ref 70–99)

## 2024-03-23 LAB — MRSA NEXT GEN BY PCR, NASAL: MRSA by PCR Next Gen: NOT DETECTED

## 2024-03-23 SURGERY — HEMIARTHROPLASTY (BIPOLAR) HIP, POSTERIOR APPROACH FOR FRACTURE
Anesthesia: General | Site: Hip | Laterality: Left

## 2024-03-23 MED ORDER — TRANEXAMIC ACID-NACL 1000-0.7 MG/100ML-% IV SOLN
INTRAVENOUS | Status: AC
  Filled 2024-03-23: qty 100

## 2024-03-23 MED ORDER — FENTANYL CITRATE (PF) 100 MCG/2ML IJ SOLN
INTRAMUSCULAR | Status: DC | PRN
  Administered 2024-03-23 (×4): 25 ug via INTRAVENOUS

## 2024-03-23 MED ORDER — CEFAZOLIN SODIUM-DEXTROSE 2-4 GM/100ML-% IV SOLN
2.0000 g | Freq: Four times a day (QID) | INTRAVENOUS | Status: AC
  Administered 2024-03-23 (×2): 2 g via INTRAVENOUS
  Filled 2024-03-23 (×2): qty 100

## 2024-03-23 MED ORDER — ACETAMINOPHEN 10 MG/ML IV SOLN
INTRAVENOUS | Status: AC
  Filled 2024-03-23: qty 100

## 2024-03-23 MED ORDER — MORPHINE SULFATE (PF) 10 MG/ML IJ SOLN
INTRAMUSCULAR | Status: DC | PRN
  Administered 2024-03-23: 60 mL via INTRAMUSCULAR

## 2024-03-23 MED ORDER — HYDROCODONE-ACETAMINOPHEN 5-325 MG PO TABS: 1.0000 | ORAL_TABLET | Freq: Four times a day (QID) | ORAL | Status: AC | PRN

## 2024-03-23 MED ORDER — CEFAZOLIN SODIUM-DEXTROSE 2-4 GM/100ML-% IV SOLN
INTRAVENOUS | Status: AC
  Filled 2024-03-23: qty 100

## 2024-03-23 MED ORDER — CEFAZOLIN SODIUM-DEXTROSE 2-4 GM/100ML-% IV SOLN
2.0000 g | Freq: Four times a day (QID) | INTRAVENOUS | Status: AC
  Administered 2024-03-23: 2 g via INTRAVENOUS

## 2024-03-23 MED ORDER — ASPIRIN 325 MG PO TBEC
325.0000 mg | DELAYED_RELEASE_TABLET | Freq: Every day | ORAL | Status: DC
  Administered 2024-03-24 – 2024-03-28 (×5): 325 mg via ORAL
  Filled 2024-03-23 (×5): qty 1

## 2024-03-23 MED ORDER — SEVOFLURANE IN SOLN
RESPIRATORY_TRACT | Status: AC
Start: 2024-03-23 — End: 2024-03-23
  Filled 2024-03-23: qty 250

## 2024-03-23 MED ORDER — BUPIVACAINE-EPINEPHRINE (PF) 0.5% -1:200000 IJ SOLN
INTRAMUSCULAR | Status: AC
  Filled 2024-03-23: qty 60

## 2024-03-23 MED ORDER — OXYCODONE HCL 5 MG PO TABS: 5.0000 mg | ORAL_TABLET | Freq: Once | ORAL | Status: DC | PRN

## 2024-03-23 MED ORDER — OXYCODONE HCL 5 MG/5ML PO SOLN: 5.0000 mg | Freq: Once | ORAL | Status: DC | PRN

## 2024-03-23 MED ORDER — ROCURONIUM BROMIDE 100 MG/10ML IV SOLN
INTRAVENOUS | Status: DC | PRN
Start: 2024-03-23 — End: 2024-03-23
  Administered 2024-03-23 (×2): 20 mg via INTRAVENOUS
  Administered 2024-03-23: 50 mg via INTRAVENOUS

## 2024-03-23 MED ORDER — SODIUM CHLORIDE 0.9 % IV SOLN: INTRAVENOUS | Status: DC | PRN

## 2024-03-23 MED ORDER — ACETAMINOPHEN 10 MG/ML IV SOLN
INTRAVENOUS | Status: DC | PRN
  Administered 2024-03-23: 1000 mg via INTRAVENOUS

## 2024-03-23 MED ORDER — 0.9 % SODIUM CHLORIDE (POUR BTL) OPTIME
TOPICAL | Status: DC | PRN
  Administered 2024-03-23: 500 mL

## 2024-03-23 MED ORDER — FENTANYL CITRATE (PF) 100 MCG/2ML IJ SOLN
INTRAMUSCULAR | Status: AC
  Filled 2024-03-23: qty 2

## 2024-03-23 MED ORDER — LIDOCAINE HCL (CARDIAC) PF 100 MG/5ML IV SOSY
PREFILLED_SYRINGE | INTRAVENOUS | Status: DC | PRN
  Administered 2024-03-23: 70 mg via INTRAVENOUS

## 2024-03-23 MED ORDER — TRANEXAMIC ACID-NACL 1000-0.7 MG/100ML-% IV SOLN
1000.0000 mg | INTRAVENOUS | Status: AC
  Administered 2024-03-23 (×2): 1000 mg via INTRAVENOUS

## 2024-03-23 MED ORDER — SODIUM CHLORIDE 0.9 % IR SOLN
Status: DC | PRN
  Administered 2024-03-23: 3000 mL

## 2024-03-23 MED ORDER — EPHEDRINE SULFATE-NACL 50-0.9 MG/10ML-% IV SOSY
PREFILLED_SYRINGE | INTRAVENOUS | Status: DC | PRN
Start: 2024-03-23 — End: 2024-03-23
  Administered 2024-03-23 (×5): 5 mg via INTRAVENOUS

## 2024-03-23 MED ORDER — PROPOFOL 10 MG/ML IV BOLUS
INTRAVENOUS | Status: DC | PRN
  Administered 2024-03-23: 100 mg via INTRAVENOUS

## 2024-03-23 MED ORDER — PHENYLEPHRINE 80 MCG/ML (10ML) SYRINGE FOR IV PUSH (FOR BLOOD PRESSURE SUPPORT)
PREFILLED_SYRINGE | INTRAVENOUS | Status: DC | PRN
  Administered 2024-03-23 (×2): 160 ug via INTRAVENOUS
  Administered 2024-03-23 (×2): 80 ug via INTRAVENOUS
  Administered 2024-03-23: 160 ug via INTRAVENOUS
  Administered 2024-03-23: 80 ug via INTRAVENOUS
  Administered 2024-03-23: 160 ug via INTRAVENOUS
  Administered 2024-03-23 (×2): 80 ug via INTRAVENOUS
  Administered 2024-03-23: 160 ug via INTRAVENOUS

## 2024-03-23 MED ORDER — HYDROMORPHONE HCL 1 MG/ML IJ SOLN
INTRAMUSCULAR | Status: AC
  Filled 2024-03-23: qty 1

## 2024-03-23 MED ORDER — HYDROMORPHONE HCL 1 MG/ML IJ SOLN
INTRAMUSCULAR | Status: DC | PRN
  Administered 2024-03-23: .25 mg via INTRAVENOUS

## 2024-03-23 MED ORDER — ONDANSETRON HCL 4 MG/2ML IJ SOLN
INTRAMUSCULAR | Status: DC | PRN
  Administered 2024-03-23: 4 mg via INTRAVENOUS

## 2024-03-23 MED ORDER — SUGAMMADEX SODIUM 200 MG/2ML IV SOLN
INTRAVENOUS | Status: DC | PRN
  Administered 2024-03-23: 200 mg via INTRAVENOUS

## 2024-03-23 MED ORDER — DEXAMETHASONE SODIUM PHOSPHATE 10 MG/ML IJ SOLN
INTRAMUSCULAR | Status: DC | PRN
Start: 2024-03-23 — End: 2024-03-23
  Administered 2024-03-23: 10 mg via INTRAVENOUS

## 2024-03-23 MED ORDER — MORPHINE SULFATE (PF) 2 MG/ML IV SOLN: 1.0000 mg | INTRAVENOUS | Status: AC | PRN

## 2024-03-23 MED ORDER — FENTANYL CITRATE (PF) 100 MCG/2ML IJ SOLN: 25.0000 ug | INTRAMUSCULAR | Status: DC | PRN

## 2024-03-23 MED ORDER — MORPHINE SULFATE (PF) 10 MG/ML IV SOLN
INTRAVENOUS | Status: AC
  Filled 2024-03-23: qty 1

## 2024-03-23 SURGICAL SUPPLY — 63 items
ALCOHOL 70% 16 OZ (MISCELLANEOUS) ×1 IMPLANT
BALL HIP ARTICU 28 +5 (Hips) IMPLANT
BLADE SAGITTAL WIDE XTHICK NO (BLADE) ×1 IMPLANT
BNDG COHESIVE 4X5 TAN STRL LF (GAUZE/BANDAGES/DRESSINGS) ×2 IMPLANT
CHLORAPREP W/TINT 26 (MISCELLANEOUS) ×3 IMPLANT
DRAPE INCISE IOBAN 66X60 STRL (DRAPES) ×2 IMPLANT
DRAPE SHEET LG 3/4 BI-LAMINATE (DRAPES) ×1 IMPLANT
DRAPE TABLE BACK 80X90 (DRAPES) ×1 IMPLANT
DRAPE U-SHAPE 47X51 STRL (DRAPES) ×2 IMPLANT
ELECT BLADE 6.5 EXT (BLADE) ×1 IMPLANT
ELECT CAUTERY BLADE 6.4 (BLADE) ×1 IMPLANT
ELECTRODE BLDE 4.0 EZ CLN MEGD (MISCELLANEOUS) ×1 IMPLANT
ELECTRODE REM PT RTRN 9FT ADLT (ELECTROSURGICAL) ×2 IMPLANT
EVACUATOR 1/8 PVC DRAIN (DRAIN) ×1 IMPLANT
GAUZE SPONGE 4X4 12PLY STRL (GAUZE/BANDAGES/DRESSINGS) ×6 IMPLANT
GAUZE XEROFORM 1X8 LF (GAUZE/BANDAGES/DRESSINGS) ×2 IMPLANT
GAUZE XEROFORM 5X9 LF (GAUZE/BANDAGES/DRESSINGS) ×1 IMPLANT
GLOVE INDICATOR 8.0 STRL GRN (GLOVE) ×1 IMPLANT
GLOVE PI ORTHO PRO STRL SZ8 (GLOVE) ×1 IMPLANT
GLOVE SURG ORTHO 8.5 STRL (GLOVE) ×1 IMPLANT
GLOVE SURG SYN 7.5 PF PI (GLOVE) ×2 IMPLANT
GOWN STRL REUS W/ TWL LRG LVL3 (GOWN DISPOSABLE) ×2 IMPLANT
GOWN STRL REUS W/ TWL XL LVL3 (GOWN DISPOSABLE) ×2 IMPLANT
GOWN STRL REUS W/TWL LRG LVL4 (GOWN DISPOSABLE) ×1 IMPLANT
HEAD BIPOLAR PROS AML 52 (Hips) IMPLANT
HOLSTER ELECTROSUGICAL PENCIL (MISCELLANEOUS) ×1 IMPLANT
IV NS 1000ML BAXH (IV SOLUTION) ×1 IMPLANT
KIT TURNOVER KIT A (KITS) ×2 IMPLANT
MANIFOLD NEPTUNE II (INSTRUMENTS) ×2 IMPLANT
NDL FILTER BLUNT 18X1 1/2 (NEEDLE) ×1 IMPLANT
NDL HYPO 21X1.5 SAFETY (NEEDLE) ×1 IMPLANT
NDL SPNL 18GX3.5 QUINCKE PK (NEEDLE) ×2 IMPLANT
NEEDLE FILTER BLUNT 18X1 1/2 (NEEDLE) IMPLANT
NEEDLE HYPO 21X1.5 SAFETY (NEEDLE) ×1 IMPLANT
NEEDLE SPNL 18GX3.5 QUINCKE PK (NEEDLE) IMPLANT
NS IRRIG 1000ML POUR BTL (IV SOLUTION) ×2 IMPLANT
PACK HIP COMPR (MISCELLANEOUS) ×1 IMPLANT
PACK HIP PROSTHESIS (MISCELLANEOUS) ×1 IMPLANT
PAD ABD DERMACEA PRESS 5X9 (GAUZE/BANDAGES/DRESSINGS) ×3 IMPLANT
PADDING CAST BLEND 4X4 NS (MISCELLANEOUS) ×2 IMPLANT
PENCIL SMOKE EVACUATOR (MISCELLANEOUS) ×2 IMPLANT
PILLOW ABDUCTION FOAM SM (MISCELLANEOUS) ×1 IMPLANT
RETRIEVER SUT HEWSON (MISCELLANEOUS) IMPLANT
SCRUB CHG 4% DYNA-HEX 4OZ (MISCELLANEOUS) ×1 IMPLANT
SOLUTION PREP PVP 2OZ (MISCELLANEOUS) ×1 IMPLANT
SPONGE T-LAP 18X18 ~~LOC~~+RFID (SPONGE) ×4 IMPLANT
STAPLER SKIN PROX 35W (STAPLE) ×1 IMPLANT
STEM SUMMIT PRESSFIT HIP SZ7 (Hips) IMPLANT
SUT VIC AB 0 CT1 36 (SUTURE) IMPLANT
SUT VIC AB 1 CT1 36 (SUTURE) IMPLANT
SUT VIC AB 2-0 CT1 (SUTURE) IMPLANT
SUT VICRYL AB 3-0 FS1 BRD 27IN (SUTURE) IMPLANT
SUTURE FIBERWR #2 38 BLUE 1/2 (SUTURE) IMPLANT
SYR 10ML LL (SYRINGE) ×1 IMPLANT
SYR 30ML LL (SYRINGE) ×2 IMPLANT
SYR 50ML LL SCALE MARK (SYRINGE) ×1 IMPLANT
TAPE CLOTH SURG 4X10 WHT LF (GAUZE/BANDAGES/DRESSINGS) ×1 IMPLANT
TAPE MICROFOAM 4IN (TAPE) ×1 IMPLANT
TIP FAN IRRIG PULSAVAC PLUS (DISPOSABLE) ×1 IMPLANT
TRAP FLUID SMOKE EVACUATOR (MISCELLANEOUS) ×2 IMPLANT
TUBE SUCT KAM VAC (TUBING) ×1 IMPLANT
WATER STERILE IRR 1000ML POUR (IV SOLUTION) ×2 IMPLANT
WATER STERILE IRR 500ML POUR (IV SOLUTION) ×1 IMPLANT

## 2024-03-23 NOTE — Op Note (Signed)
 ORTHOPEDIC OPERATIVE REPORT Calvin Bates [id number] 03/23/2024 Pre-op Diagnosis:  Closed left impacted angulated femoral neck fracture Post-op Diagnosis:  Closed left impacted angulated femoral neck fracture Procedure:  HEMIARTHROPLASTY (BIPOLAR) HIP, POSTERIOR APPROACH FOR FRACTURE: 72763 (CPT)  Surgeon:  Cordella SAUNDERS. Maryrose, MD Assistant Surgeon:  Circulator: Waldon Sharlet DASEN, RN Scrub Person: Aneta Dasie NOVAK, RN; Leafy Joesph RAMAN Vendor Representative : Claudene Heinz Anesthesia Type:   General Endotracheal Anesthesia Fluids:   1000 ml of crystalloid. Estimated Blood Loss:   75 ml Urine output:   No Foley was employed for this case. Tourniquet:   No tourniquet was employed for this case. Complications:   None. Disposition:   The patient was taken to the PACU in stable condition having tolerated the procedure well. Grafts/Implants:    Implant Name Type Inv. Item Serial No. Manufacturer Lot No. LRB No. Used Action  STEM SUMMIT PRESSFIT HIP SZ7 - [id number] Hips STEM SUMMIT PRESSFIT HIP SZ7  DEPU[employer] [id number] Left 1 Implanted  HEAD BIPOLAR PROS AML 52 - [id number] Hips HEAD BIPOLAR PROS AML 52  DEPUY [employer]TH[employer] [id number] Left 1 Implanted  BALL HIP ARTICU 28 +5 - [id number] Hips BALL HIP ARTICU 28 +5  DEPUY ORTH[employer] [id number] Left 1 Implanted   Specimen(s):   None. Indication for surgery:   The patient is a 73 year old male who suffered a ground-level fall fracturing his left hip.  Please see dictated history and physical examination and orthopedic consultation for complete details. Operative technique:   After being medically cleared the patient was placed on the operative schedule after obtaining informed consent. The patient was taken to the operating room where they were placed under General Endotracheal Anesthesia in their bed for comfort. The patient was then positioned onto the operating room table in the supine position. The patient was then positioned into right  lateral recumbency with the left hip uppermost. A pelvic positioner was employed to maintain positioning on adequate padding. The left hip was isolated with nonsterile plastic U-drapes. The left hip was prepped and draped in routine fashion with Hibiclens and alcohol with a final ChloraPrep. The proposed incision was marked over the left hip greater trochanter region measuring approximately 15 cm. A time-out then occurred per Joint Commission standards to positively identify the patient, the procedure, the surgeon, and the operative site. All concurred.  The incision was made with a #10 Blade. The subcutaneous tissue was dissected with electrocautery down to the iliotibial band. Hemostasis was achieved with electrocautery. A nick was made in the iliotibial band and it was split along its fibers with Mayo scissors. The gluteus musculature was bluntly dissected proximally. With gentle internal rotation of the hip, a posterior approach was made around the greater trochanter in a subperiosteal fashion with electrocautery. The external rotators were carefully peeled off the joint capsule. The piriformis tendon was identified, tagged with #2 Orthocord and transected. The Orthocord will be used for later reattachment. The posterior joint capsule was opened in a T fashion with each side tagged with a #1 Vicryl suture for possible later closure. With the neck cutting guide as a reference, I used an oscillating saw to make the femoral neck cut. The bone remnants were removed. I then placed a corkscrew into the femoral head and removed it and measured it. It measured approximately 52 mm. I then trialed the acetabulum with a 52 mm trial and had an excellent suction fit. That was chosen for final implantation. I then turned my attention back to preparation of  the proximal femur.  With the leg reposition the femoral elevator was used to bring the proximal femur up into the field. I placed a blunt canal finder down the canal  and sequentially reamed from a size 2 to a size 7. A lateralizer was employed to remove bone from the medial aspect of the greater trochanter to prevent varus positioning of the stem. I then began broaching with a size 2 to a size 7 in approximately 15 of anteversion matching the patient's natural anatomy. A size 7 broach gave excellent stability. I then trialed the hip and found excellent stability with no shuck, no lateral instability, and 90 of hip flexion and greater than 70 of internal rotation. Limb lengths appeared equal.  I then used pulsatile lavage to prepare the femoral canal as well as cleanse the tissues and keep them moist as had been done multiple times throughout the case. The Summit basic size 7 Press-Fit stem was then placed in the same anteversion. I then retrialed and found the same stability as described above. The trial head was removed and the real head was impacted. The bipolar head was assembled and the joint again reduced. The same stability as described above was achieved. The area was again pulsatile lavaged and inspected prior to closing. The posterior joint capsule was closed with #1 Vicryl in a cruciate pattern. The piriformis was reattached to the greater trochanter with the #2 Orthocord through drill holes. The iliotibial band was closed with #1 Vicryl in a cruciate pattern. The gluteus fascia was closed in similar fashion. The subcutaneous tissue was lavaged again and closed with an inverted suture pattern of 2-0 Vicryl. The skin was stapled. All sponge and needle counts were correct at the end of the case. The patient was covered with a nonadherent bulky dressing and the surgeon held the leg in abduction while they were undraped and placed back into their bed for comfort. Once the patient was repositioned supine, limb lengths and external rotation of the feet appeared symmetric. Their toes were pink and warm with a brisk capillary refill time. Dorsalis pedis and posterior  tibial pulses were 2+ and symmetric. The patient was taken to the recovery room in stable condition having tolerated the procedure well.   The patient's family was updated on the patient's surgical findings and their condition.  Implants:  A Depuy Summit basic size 7 stem was Press-Fit, a 28 mm + 5 mm femoral head was used with a 52 mm bipolar head.    Cordella Lamar Knack M.D. 03/23/2024 1:47 PM

## 2024-03-23 NOTE — Anesthesia Postprocedure Evaluation (Signed)
 Anesthesia Post Note  Patient: Calvin Bates  Procedure(s) Performed: HEMIARTHROPLASTY (BIPOLAR) HIP, POSTERIOR APPROACH FOR FRACTURE (Left: Hip)  Patient location during evaluation: PACU Anesthesia Type: General Level of consciousness: awake and alert Pain management: pain level controlled Vital Signs Assessment: post-procedure vital signs reviewed and stable Respiratory status: spontaneous breathing, nonlabored ventilation, respiratory function stable and patient connected to nasal cannula oxygen Cardiovascular status: blood pressure returned to baseline and stable Postop Assessment: no apparent nausea or vomiting Anesthetic complications: no   No notable events documented.   Last Vitals:  Vitals:   03/23/24 1400 03/23/24 1415  BP: (!) 119/52 124/60  Pulse: 73 70  Resp: 15 16  Temp:  (!) 36.1 C  SpO2: 95% 93%    Last Pain:  Vitals:   03/23/24 1415  TempSrc:   PainSc: 0-No pain                 Lendia LITTIE Mae

## 2024-03-23 NOTE — Progress Notes (Signed)
 PT Cancellation Note  Patient Details Name: SAINT HANK MRN: [id number] DOB: 1951/05/26   Cancelled Treatment:    Reason Eval/Treat Not Completed: Other (comment) Orders received, chart reviewed. Planning for surgery this date. Will hold until medically appropriate.   Maryanne Finder, PT, DPT Physical Therapist - [facility]   Hieu Herms A Lavine Hargrove 03/23/2024, 8:16 AM

## 2024-03-23 NOTE — Transfer of Care (Signed)
 Immediate Anesthesia Transfer of Care Note  Patient: Calvin Bates  Procedure(s) Performed: HEMIARTHROPLASTY (BIPOLAR) HIP, POSTERIOR APPROACH FOR FRACTURE (Left: Hip)  Patient Location: PACU  Anesthesia Type:General  Level of Consciousness: drowsy  Airway & Oxygen Therapy: Patient Spontanous Breathing and Patient connected to face mask oxygen  Post-op Assessment: Report given to RN and Post -op Vital signs reviewed and stable  Post vital signs: Reviewed and stable  Last Vitals:  Vitals Value Taken Time  BP 98/44 03/23/24 13:40  Temp    Pulse 62 03/23/24 13:44  Resp 15 03/23/24 13:44  SpO2 100 % 03/23/24 13:44  Vitals shown include unfiled device data.  Last Pain:  Vitals:   03/23/24 0815  TempSrc:   PainSc: 4          Complications: No notable events documented.

## 2024-03-23 NOTE — TOC Initial Note (Addendum)
 Transition of Care [facility]) - Initial/Assessment Note    Patient Details  Name: Calvin Bates MRN: [id number] Date of Birth: 10-15-1950  [facility] CM/SW Contact:    Storm Dulski E Yarah Fuente, LCSW Phone Number: 03/23/2024, 10:04 AM  Clinical Narrative:                 Patient was admitted from [facility]. Patient is currently in the OR. PT/OT evals to be completed post-op. CSW attempted call to [facility] to confirm PLOF and that patient can return when medically ready - left a VM requesting a return call.   10:45- Call from Arland at [facility]. She confirms patient makes his own decisions, has a niece who visits weekly and takes him to church. Arland states patient can return when medically ready, but states she thinks he may need STR. She states if he is back at his baseline physically, he can return. Informed her PT and OT are pending. Patient has had HH at [facility] in the past, Arland is unsure of agency.   Expected Discharge Plan: Group Home Barriers to Discharge: Continued Medical Work up   Patient Goals and CMS Choice            Expected Discharge Plan and Services       Living arrangements for the past 2 months: Group Home                                      Prior Living Arrangements/Services Living arrangements for the past 2 months: Group Home Lives with:: Facility Resident                   Activities of Daily Living   ADL Screening (condition at time of admission) Independently performs ADLs?: Yes (appropriate for developmental age) Is the patient deaf or have difficulty hearing?: No Does the patient have difficulty seeing, even when wearing glasses/contacts?: No Does the patient have difficulty concentrating, remembering, or making decisions?: No  Permission Sought/Granted                  Emotional Assessment         Alcohol / Substance Use: Not Applicable Psych Involvement: No (comment)  Admission diagnosis:   Closed left femoral fracture (HCC) [S72.92XA] Closed fracture of left hip, initial encounter (HCC) [S72.002A] Fall, initial encounter [W19.XXXA] Patient Active Problem List   Diagnosis Date Noted   Closed left femoral fracture (HCC) 03/22/2024   Diabetes mellitus type 2, noninsulin dependent (HCC) 03/22/2024   Essential hypertension 03/22/2024   Hyperlipidemia 03/22/2024   History of stroke 03/22/2024   Schizophrenia (HCC) 03/22/2024   Pain and swelling of right lower extremity 03/22/2024   PCP:  Sampson Ethridge LABOR, MD Pharmacy:   [facility] [place] [place] - [address] [place] [address] Phone: [phone] Fax: [phone]     Social Drivers of Health (SDOH) Social History: SDOH Screenings   Food Insecurity: No Food Insecurity (03/22/2024)  Housing: Low Risk  (03/22/2024)  Transportation Needs: No Transportation Needs (03/22/2024)  Utilities: Not At Risk (03/22/2024)  Depression (PHQ2-9): Low Risk  (07/28/2022)  Social Connections: Unknown (03/22/2024)  Tobacco Use: Low Risk  (03/22/2024)   SDOH Interventions:     Readmission Risk Interventions     No data to display

## 2024-03-23 NOTE — Anesthesia Preprocedure Evaluation (Addendum)
 Anesthesia Evaluation  Patient identified by MRN, date of birth, ID band Patient awake    Reviewed: Allergy & Precautions, NPO status , Patient's Chart, lab work & pertinent test results  History of Anesthesia Complications Negative for: history of anesthetic complications  Airway Mallampati: III  TM Distance: >3 FB Neck ROM: full    Dental  (+) Edentulous Upper, Edentulous Lower   Pulmonary neg pulmonary ROS   Pulmonary exam normal        Cardiovascular hypertension, On Medications Normal cardiovascular exam     Neuro/Psych  PSYCHIATRIC DISORDERS    Schizophrenia  CVA    GI/Hepatic negative GI ROS, Neg liver ROS,,,  Endo/Other  diabetes, Type 2, Oral Hypoglycemic Agents    Renal/GU CRFRenal disease     Musculoskeletal   Abdominal   Peds  Hematology  (+) Blood dyscrasia, anemia   Anesthesia Other Findings Past Medical History: No date: Diabetes mellitus without complication (HCC) No date: Diabetic retinopathy (HCC) No date: Hyperlipemia No date: Hypertension No date: Neuropathy No date: Psychosis (HCC) No date: Stroke [facility])  Past Surgical History: No date: BACK SURGERY  BMI    Body Mass Index: 21.16 kg/m      Reproductive/Obstetrics negative OB ROS                              Anesthesia Physical Anesthesia Plan  ASA: 3  Anesthesia Plan: General ETT   Post-op Pain Management: Ofirmev  IV (intra-op)*, Toradol IV (intra-op)* and Dilaudid  IV   Induction: Intravenous  PONV Risk Score and Plan: 2 and Ondansetron , Dexamethasone  and Treatment may vary due to age or medical condition  Airway Management Planned: Oral ETT  Additional Equipment:   Intra-op Plan:   Post-operative Plan: Extubation in OR  Informed Consent: I have reviewed the patients History and Physical, chart, labs and discussed the procedure including the risks, benefits and alternatives for the proposed  anesthesia with the patient or authorized representative who has indicated his/her understanding and acceptance.     Dental Advisory Given  Plan Discussed with: Anesthesiologist, CRNA and Surgeon  Anesthesia Plan Comments: (Patient consented for risks of anesthesia including but not limited to:  - adverse reactions to medications - damage to eyes, teeth, lips or other oral mucosa - nerve damage due to positioning  - sore throat or hoarseness - Damage to heart, brain, nerves, lungs, other parts of body or loss of life  Patient voiced understanding and assent.)         Anesthesia Quick Evaluation

## 2024-03-23 NOTE — Progress Notes (Signed)
 PROGRESS NOTE    Calvin Bates  FMW:[id number] DOB: 02-21-1951 DOA: 03/22/2024 PCP: Sampson Ethridge LABOR, MD    Brief Narrative:   73 year old male with history of hypertension, hyperlipidemia, history of stroke, non-insulin -dependent diabetes mellitus, schizophrenia, with history of tardive dyskinesia, who presents ED for chief concerns of leg giving out and landing on his left side.   He reports that earlier this morning he was walking to the restroom when he felt his leg gave out on him and he fell on his left side.     He denies head trauma, loss of consciousness, chest pain, abdominal pain, dysuria, hematuria, shortness of breath, nausea, vomiting, fever, chills.   He endorses decreased range of motion of the left lower extremity.  On exam, it was noted that he had right calf swelling and patient was not able to tell me how long this has been going on, but he states this may be a new thing.  There is also right calf tenderness with palpation.   Assessment & Plan:   Principal Problem:   Closed left femoral fracture (HCC) Active Problems:   Diabetes mellitus type 2, noninsulin dependent (HCC)   Essential hypertension   History of stroke   Pain and swelling of right lower extremity   Hyperlipidemia   Schizophrenia (HCC)  Closed left femoral fracture (HCC) Orthopedics on board No evidence of syncopal event Plan: N.p.o. 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  Resting in bed.  Pain well-controlled.  Objective: Vitals:   03/22/24 1752 03/22/24 1930 03/23/24 0347 03/23/24 0742  BP: (!) 155/73 (!) 153/71 (!) 169/67 (!) 161/70  Pulse: 84 87 73 73  Resp: 16 18 17 17   Temp: 98.2 F (36.8 C) 98.5 F (36.9 C) 100 F (37.8 C) 98.4 F (36.9 C)  TempSrc: Oral Oral Oral Oral  SpO2: 95% 92% 94% 94%  Weight:      Height:        Intake/Output Summary (Last 24 hours) at 03/23/2024 1159 Last data filed at 03/23/2024 0300 Gross per 24 hour  Intake 150 ml  Output 500 ml  Net -350 ml   Filed Weights   03/22/24 1129  Weight: 65 kg    Examination:  General exam: Appears calm and comfortable  Respiratory system: Clear to auscultation. Respiratory effort normal. Cardiovascular system: S1-S2, RRR, no murmurs, no pedal edema Gastrointestinal system:, Distention, normal bowel sounds Central nervous system: Alert and oriented. No focal neurological deficits.  Tongue fasciculations Extremities: Left leg shortened externally rotated Skin: No rashes, lesions or ulcers Psychiatry: Judgement and insight appear normal. Mood & affect flattened.     Data Reviewed: I have personally reviewed following labs and imaging studies  CBC: Recent Labs  Lab 03/22/24 1232  03/23/24 0939  WBC 14.4* 11.7*  NEUTROABS 12.8* 10.0*  HGB 11.5* 10.7*  HCT 35.0* 32.7*  MCV 90.0 89.6  PLT 207 184   Basic Metabolic Panel: Recent Labs  Lab 03/22/24 1232 03/23/24 0939  NA 139 137  K 3.7 4.0  CL 104 106  CO2 24 23  GLUCOSE 137* 124*  BUN 34* 36*  CREATININE 1.34* 1.52*  CALCIUM  9.1 8.7*   GFR: Estimated Creatinine Clearance: 39.8 mL/min (A)  (by C-G formula based on SCr of 1.52 mg/dL (H)). Liver Function Tests: Recent Labs  Lab 03/22/24 1232  AST 33  ALT 28  ALKPHOS 59  BILITOT 0.8  PROT 6.1*  ALBUMIN 3.5   No results for input(s): LIPASE, AMYLASE in the last 168 hours. No results for input(s): AMMONIA in the last 168 hours. Coagulation Profile: Recent Labs  Lab 03/23/24 0939  INR 1.3*   Cardiac Enzymes: No results for input(s): CKTOTAL, CKMB, CKMBINDEX, TROPONINI in the last 168 hours. BNP (last 3 results) No results for input(s): PROBNP in the last 8760 hours. HbA1C: No results for input(s): HGBA1C in the last 72 hours. CBG: Recent Labs  Lab 03/22/24 2109 03/23/24 0743 03/23/24 0950  GLUCAP 191* 147* 116*   Lipid Profile: No results for input(s): CHOL, HDL, LDLCALC, TRIG, CHOLHDL, LDLDIRECT in the last 72 hours. Thyroid Function Tests: No results for input(s): TSH, T4TOTAL, FREET4, T3FREE, THYROIDAB in the last 72 hours. Anemia Panel: No results for input(s): VITAMINB12, FOLATE, FERRITIN, TIBC, IRON, RETICCTPCT in the last 72 hours. Sepsis Labs: No results for input(s): PROCALCITON, LATICACIDVEN in the last 168 hours.  Recent Results (from the past 240 hours)  MRSA Next Gen by PCR, Nasal     Status: None   Collection Time: 03/23/24  3:48 AM   Specimen: Nasal Mucosa; Nasal Swab  Result Value Ref Range Status   MRSA by PCR Next Gen NOT DETECTED NOT DETECTED Final    Comment: (NOTE) The GeneXpert MRSA Assay (FDA approved for NASAL specimens only), is one component of a comprehensive MRSA colonization surveillance program. It is not intended to diagnose MRSA infection nor to guide or monitor treatment for MRSA infections. Test performance is not FDA approved in patients less than 35 years old. Performed at [facility], [address] [place], [place] [address]          Radiology Studies: US  Venous Img Lower Unilateral Right  (DVT) Result Date: 03/22/2024 CLINICAL DATA:  Right lower extremity swelling EXAM: RIGHT LOWER EXTREMITY VENOUS DOPPLER ULTRASOUND TECHNIQUE: Gray-scale sonography with compression, as well as color and duplex ultrasound, were performed to evaluate the deep venous system(s) from the level of the common femoral vein through the popliteal and proximal calf veins. COMPARISON:  None Available. FINDINGS: VENOUS Normal compressibility of the common femoral, superficial femoral, and popliteal veins, as well as the visualized calf veins. Visualized portions of profunda femoral vein and great saphenous vein unremarkable. No filling defects to suggest DVT on grayscale or color Doppler imaging. Doppler waveforms show normal direction of venous flow, normal respiratory plasticity and response to augmentation. Limited views of the contralateral common femoral vein are unremarkable. OTHER None. Limitations: none IMPRESSION: 1. No evidence of deep venous thrombosis within the right lower extremity. Electronically Signed   By: Ozell Daring M.D.   On: 03/22/2024 16:01   DG Chest Portable 1 View Result Date: 03/22/2024 CLINICAL DATA:  Recent fall sent for medical clearance. EXAM: PORTABLE CHEST 1 VIEW COMPARISON:  June 24, 2023  FINDINGS: The heart size and mediastinal contours are within normal limits. Stable diffuse chronic appearing increased interstitial lung markings are seen. There is no evidence of focal consolidation, pleural effusion or pneumothorax. There are multiple chronic bilateral rib fractures. A chronic fracture of the right humeral head and neck is also noted. Multilevel degenerative changes are seen throughout the thoracic spine. IMPRESSION: Chronic appearing increased interstitial lung markings without evidence of acute or active cardiopulmonary disease. Electronically Signed   By: Suzen Dials M.D.   On: 03/22/2024 13:16   DG Hip Unilat W or Wo Pelvis 2-3 Views Left Result Date: 03/22/2024 CLINICAL  DATA:  Status post fall. EXAM: DG HIP (WITH OR WITHOUT PELVIS) 2-3V LEFT COMPARISON:  None Available. FINDINGS: An acute, impacted fracture deformity is seen involving the head and neck of the proximal left femur. A fracture deformity of indeterminate age is also seen involving the right inferior pubic ramus. There is no evidence of dislocation. Degenerative changes seen involving the left hip in the form of joint space narrowing and acetabular sclerosis. IMPRESSION: 1. Acute fracture of the proximal left femur. 2. Fracture of the right inferior pubic ramus of indeterminate age. Electronically Signed   By: Suzen Dials M.D.   On: 03/22/2024 12:17        Scheduled Meds:  [MAR Hold] amLODipine   10 mg Oral Daily   [MAR Hold] atorvastatin   20 mg Oral QHS   [MAR Hold] docusate sodium   100 mg Oral BID   [MAR Hold] hydrALAZINE   25 mg Oral BID   [MAR Hold] insulin  aspart  0-5 Units Subcutaneous QHS   [MAR Hold] insulin  aspart  0-9 Units Subcutaneous TID WC   [MAR Hold] lisinopril   40 mg Oral Daily   [MAR Hold] metoprolol  tartrate  25 mg Oral BID   [MAR Hold] multivitamin with minerals  1 tablet Oral Daily   [MAR Hold] OLANZapine   2.5 mg Oral QHS   [MAR Hold] thiamine   100 mg Oral Daily   Continuous Infusions:   LOS: 1 day   Calvin KATHEE Robson, MD [facility]   If 7PM-7AM, please contact night-coverage  03/23/2024, 11:59 AM

## 2024-03-23 NOTE — Progress Notes (Signed)
 OT Cancellation Note  Patient Details Name: Calvin Bates MRN: [id number] DOB: Nov 26, 1950   Cancelled Treatment:    Reason Eval/Treat Not Completed: Medical issues which prohibited therapy. Received order, reviewed chart. Pt is having surgery this date. Will complete OT evaluation at a later date, as pt is medically appropriate.  Suzen Hock 03/23/2024, 8:23 AM

## 2024-03-23 NOTE — Anesthesia Procedure Notes (Signed)
 Procedure Name: Intubation Date/Time: 03/23/2024 11:10 AM  Performed by: Belinda, Jimmy Plessinger, CRNAPre-anesthesia Checklist: Patient identified, Emergency Drugs available, Suction available and Patient being monitored Patient Re-evaluated:Patient Re-evaluated prior to induction Oxygen Delivery Method: Circle system utilized Preoxygenation: Pre-oxygenation with 100% oxygen Induction Type: IV induction Ventilation: Mask ventilation without difficulty Laryngoscope Size: McGrath and 4 Grade View: Grade I Tube type: Oral Tube size: 7.0 mm Number of attempts: 1 Airway Equipment and Method: Stylet Placement Confirmation: ETT inserted through vocal cords under direct vision, positive ETCO2 and breath sounds checked- equal and bilateral Secured at: 21 cm Tube secured with: Tape Dental Injury: Teeth and Oropharynx as per pre-operative assessment

## 2024-03-23 NOTE — Plan of Care (Signed)
  Problem: Coping: Goal: Ability to adjust to condition or change in health will improve Outcome: Progressing   Problem: Metabolic: Goal: Ability to maintain appropriate glucose levels will improve Outcome: Progressing   Problem: Skin Integrity: Goal: Risk for impaired skin integrity will decrease Outcome: Progressing   Problem: Coping: Goal: Level of anxiety will decrease Outcome: Progressing   Problem: Pain Managment: Goal: General experience of comfort will improve and/or be controlled Outcome: Progressing

## 2024-03-24 DIAGNOSIS — S72022A Displaced fracture of epiphysis (separation) (upper) of left femur, initial encounter for closed fracture: Secondary | ICD-10-CM | POA: Diagnosis not present

## 2024-03-24 LAB — GLUCOSE, CAPILLARY
Glucose-Capillary: 176 mg/dL — ABNORMAL HIGH (ref 70–99)
Glucose-Capillary: 189 mg/dL — ABNORMAL HIGH (ref 70–99)
Glucose-Capillary: 188 mg/dL — ABNORMAL HIGH (ref 70–99)
Glucose-Capillary: 201 mg/dL — ABNORMAL HIGH (ref 70–99)
Glucose-Capillary: 170 mg/dL — ABNORMAL HIGH (ref 70–99)

## 2024-03-24 MED ORDER — ADULT MULTIVITAMIN W/MINERALS CH
1.0000 | ORAL_TABLET | Freq: Every day | ORAL | Status: DC
  Administered 2024-03-24 – 2024-03-28 (×5): 1 via ORAL
  Filled 2024-03-24 (×5): qty 1

## 2024-03-24 MED ORDER — ENOXAPARIN SODIUM 40 MG/0.4ML IJ SOSY
40.0000 mg | PREFILLED_SYRINGE | INTRAMUSCULAR | Status: DC
  Administered 2024-03-24 – 2024-03-28 (×5): 40 mg via SUBCUTANEOUS
  Filled 2024-03-24 (×5): qty 0.4

## 2024-03-24 MED ORDER — BISACODYL 10 MG RE SUPP
10.0000 mg | Freq: Every day | RECTAL | Status: DC | PRN
  Administered 2024-03-26: 10 mg via RECTAL
  Filled 2024-03-24: qty 1

## 2024-03-24 MED ORDER — SENNOSIDES-DOCUSATE SODIUM 8.6-50 MG PO TABS
1.0000 | ORAL_TABLET | Freq: Two times a day (BID) | ORAL | Status: DC
  Administered 2024-03-24 – 2024-03-27 (×7): 1 via ORAL
  Filled 2024-03-24 (×8): qty 1

## 2024-03-24 MED ORDER — POLYETHYLENE GLYCOL 3350 17 G PO PACK
17.0000 g | PACK | Freq: Every day | ORAL | Status: DC | PRN
  Administered 2024-03-24 – 2024-03-26 (×3): 17 g via ORAL
  Filled 2024-03-24 (×3): qty 1

## 2024-03-24 MED ORDER — GLUCERNA SHAKE PO LIQD
237.0000 mL | Freq: Three times a day (TID) | ORAL | Status: DC
  Administered 2024-03-24 – 2024-03-27 (×12): 237 mL via ORAL

## 2024-03-24 NOTE — Plan of Care (Signed)
  Problem: Health Behavior/Discharge Planning: Goal: Ability to manage health-related needs will improve Outcome: Progressing   Problem: Metabolic: Goal: Ability to maintain appropriate glucose levels will improve Outcome: Progressing   Problem: Nutrition: Goal: Adequate nutrition will be maintained Outcome: Progressing   Problem: Coping: Goal: Level of anxiety will decrease Outcome: Progressing   Problem: Pain Managment: Goal: General experience of comfort will improve and/or be controlled Outcome: Progressing

## 2024-03-24 NOTE — Evaluation (Signed)
 Occupational Therapy Evaluation Patient Details Name: Calvin Bates MRN: [id number] DOB: 1950-11-20 Today's Date: 03/24/2024   History of Present Illness   Patient is a 73 year old male with ground level fall fracturing left hip. S/p left hip hemiarthroplasty, posterior approach. PMH: HTN, HLD, stroke, DM, schizophrenia, tardive dyskinesia.     Clinical Impressions Chart reviewed to date, pt greeted semi supine in bed, agreeable to OT evaluation. PTA Pt lives in a group home, performs ADL with MOD I, has assist for med management and staff helps cook him meals. Pt amb with RW per his report. Pt presents with deficits in strength, endurance, activity tolerance, balance affecting safe and optimal ADL completion. Pt requires MAX A +2 for bed mobility, STS with MAX A +2, Step pivot to bedside chair with MAX A +2 with step by step multi modal cues. MAX A required for LB dressing. Pt is left in bedside chair, all needs met. OT will follow acutely to facilitate optimal ADL/functional activity performance.      If plan is discharge home, recommend the following:   A lot of help with bathing/dressing/bathroom;A lot of help with walking and/or transfers;Supervision due to cognitive status     Functional Status Assessment   Patient has had a recent decline in their functional status and demonstrates the ability to make significant improvements in function in a reasonable and predictable amount of time.     Equipment Recommendations   Other (comment) (defer to next venue of care)     Recommendations for Other Services         Precautions/Restrictions   Precautions Precautions: Posterior Hip;Fall Recall of Precautions/Restrictions: Impaired Restrictions Weight Bearing Restrictions Per Provider Order: Yes LLE Weight Bearing Per Provider Order: Weight bearing as tolerated     Mobility Bed Mobility Overal bed mobility: Needs Assistance Bed Mobility: Supine to Sit     Supine to  sit: Max assist, +2 for physical assistance          Transfers Overall transfer level: Needs assistance Equipment used: Rolling walker (2 wheels) Transfers: Sit to/from Stand, Bed to chair/wheelchair/BSC Sit to Stand: Max assist, +2 physical assistance, From elevated surface     Step pivot transfers: Max assist, +2 physical assistance     General transfer comment: step by step multi modal cues for technique      Balance Overall balance assessment: Needs assistance Sitting-balance support: Feet supported Sitting balance-Leahy Scale: Fair   Postural control: Right lateral lean Standing balance support: Bilateral upper extremity supported, Reliant on assistive device for balance Standing balance-Leahy Scale: Poor                             ADL either performed or assessed with clinical judgement   ADL Overall ADL's : Needs assistance/impaired     Grooming: Contact guard assist;Minimal assistance;Sitting               Lower Body Dressing: Maximal assistance Lower Body Dressing Details (indicate cue type and reason): donn socks Toilet Transfer: Maximal assistance;+2 for safety/equipment;+2 for physical assistance;Rolling walker (2 wheels) Toilet Transfer Details (indicate cue type and reason): simulated to bedside chair, step pivot Toileting- Clothing Manipulation and Hygiene: Maximal assistance;Sit to/from stand               Vision Patient Visual Report: No change from baseline       Perception         Praxis  Pertinent Vitals/Pain Pain Assessment Pain Assessment: 0-10 Pain Score: 1  Pain Location: L hip Pain Descriptors / Indicators: Discomfort Pain Intervention(s): Monitored during session, Limited activity within patient's tolerance, Ice applied     Extremity/Trunk Assessment Upper Extremity Assessment Upper Extremity Assessment: Generalized weakness   Lower Extremity Assessment Lower Extremity Assessment: Defer to PT  evaluation LLE Deficits / Details: patient able to complete SLR. limited weight bearing with standing  due to pain in the hip       Communication Communication Communication: No apparent difficulties Factors Affecting Communication:  (noted tardive dyskinesia)   Cognition Arousal: Alert Behavior During Therapy: WFL for tasks assessed/performed Cognition: History of cognitive impairments, Cognition impaired     Awareness: Online awareness impaired Memory impairment (select all impairments): Short-term memory, Working memory Attention impairment (select first level of impairment): Sustained attention Executive functioning impairment (select all impairments): Sequencing, Reasoning, Problem solving                   Following commands: Impaired Following commands impaired: Follows one step commands with increased time     Cueing  General Comments   Cueing Techniques: Verbal cues;Visual cues  dizziness reported with position changes, resolved with time   Exercises Other Exercises Other Exercises: edu re role of OT, role of rehab, preacutions   Shoulder Instructions      Home Living Family/patient expects to be discharged to:: Group home     Type of Home: House Home Access: Ramped entrance     Home Layout: One level     Bathroom Shower/Tub: Tub/shower unit         Home Equipment: Agricultural consultant (2 wheels)   Additional Comments: patient reports he has lived in the group home for 14 years.      Prior Functioning/Environment Prior Level of Function : Needs assist;History of Falls (last six months)             Mobility Comments: ambulation with rolling walker. multiple fall reported ADLs Comments: assistance for medication management and meals.    OT Problem List: Decreased strength;Decreased activity tolerance;Decreased cognition;Decreased knowledge of use of DME or AE;Impaired balance (sitting and/or standing);Decreased safety awareness;Decreased  knowledge of precautions   OT Treatment/Interventions: Self-care/ADL training;Energy conservation;Balance training;Therapeutic exercise;DME and/or AE instruction;Therapeutic activities;Patient/family education      OT Goals(Current goals can be found in the care plan section)   Acute Rehab OT Goals Patient Stated Goal: go home OT Goal Formulation: With patient Time For Goal Achievement: 04/07/24 Potential to Achieve Goals: Good ADL Goals Pt Will Perform Grooming: with supervision;sitting;standing Pt Will Perform Lower Body Dressing: with min assist;sitting/lateral leans;sit to/from stand Pt Will Transfer to Toilet: with min assist;ambulating Pt Will Perform Toileting - Clothing Manipulation and hygiene: with min assist;sitting/lateral leans;sit to/from stand   OT Frequency:  Min 2X/week    Co-evaluation PT/OT/SLP Co-Evaluation/Treatment: Yes Reason for Co-Treatment: To address functional/ADL transfers PT goals addressed during session: Mobility/safety with mobility OT goals addressed during session: ADL's and self-care      AM-PAC OT 6 Clicks Daily Activity     Outcome Measure Help from another person eating meals?: None Help from another person taking care of personal grooming?: None Help from another person toileting, which includes using toliet, bedpan, or urinal?: A Lot Help from another person bathing (including washing, rinsing, drying)?: A Lot Help from another person to put on and taking off regular upper body clothing?: A Little Help from another person to put on and taking off regular  lower body clothing?: A Lot 6 Click Score: 17   End of Session Equipment Utilized During Treatment: Rolling walker (2 wheels) Nurse Communication: Mobility status  Activity Tolerance: Patient tolerated treatment well Patient left: in chair;with call bell/phone within reach;with chair alarm set  OT Visit Diagnosis: Other abnormalities of gait and mobility (R26.89);Muscle weakness  (generalized) (M62.81)                Time: 9162-9095 OT Time Calculation (min): 27 min Charges:  OT General Charges $OT Visit: 1 Visit OT Evaluation $OT Eval Moderate Complexity: 1 Mod Therisa Sheffield, OTD OTR/L  03/24/24, 10:34 AM

## 2024-03-24 NOTE — [place] FL2 (Signed)
 [place]  MEDICAID FL2 LEVEL OF CARE FORM     IDENTIFICATION  Patient Name: Calvin Bates Birthdate: 1951-03-31 Sex: male Admission Date (Current Location): 03/22/2024  [facility] and [place] Number:  Chiropodist and Address:  [facility], [address], [place], [place] [address]      Provider Number: [id number]  Attending Physician Name and Address:  Jhonny Calvin NOVAK, MD  Relative Name and Phone Number:  Miriam Boyer (Niece)  [phone] [facility])    Current Level of Care: Hospital Recommended Level of Care: Skilled Nursing Facility Prior Approval Number:    Date Approved/Denied:   PASRR Number: pending  Discharge Plan:      Current Diagnoses: Patient Active Problem List   Diagnosis Date Noted   Closed left femoral fracture (HCC) 03/22/2024   Diabetes mellitus type 2, noninsulin dependent (HCC) 03/22/2024   Essential hypertension 03/22/2024   Hyperlipidemia 03/22/2024   History of stroke 03/22/2024   Schizophrenia (HCC) 03/22/2024   Pain and swelling of right lower extremity 03/22/2024    Orientation RESPIRATION BLADDER Height & Weight     Self, Time, Situation, Place  Normal External catheter Weight: 143 lb 4.8 oz (65 kg) Height:  5' 9 (175.3 cm)  BEHAVIORAL SYMPTOMS/MOOD NEUROLOGICAL BOWEL NUTRITION STATUS        Diet (carb modified)  AMBULATORY STATUS COMMUNICATION OF NEEDS Skin   Extensive Assist Verbally Surgical wounds (L hip)                       Personal Care Assistance Level of Assistance  Bathing, Feeding, Dressing Bathing Assistance: Maximum assistance   Dressing Assistance: Maximum assistance     Functional Limitations Info  Sight Sight Info: Impaired        SPECIAL CARE FACTORS FREQUENCY  PT (By licensed PT), OT (By licensed OT)     PT Frequency: 5 times per week OT Frequency: 5 times per week            Contractures      Additional Factors Info  Code Status, Allergies Code Status  Info: full Allergies Info: Seasonal Ic (Octacosanol)           Current Medications (03/24/2024):  This is the current hospital active medication list Current Facility-Administered Medications  Medication Dose Route Frequency Provider Last Rate Last Admin   acetaminophen  (TYLENOL ) tablet 650 mg  650 mg Oral Q6H PRN Maryrose Cordella Charleston, MD       albuterol  (PROVENTIL ) (2.5 MG/3ML) 0.083% nebulizer solution 2.5 mg  2.5 mg Nebulization Q6H PRN Maryrose Cordella Charleston, MD       amLODipine  (NORVASC ) tablet 10 mg  10 mg Oral Daily Crawley, Gregory Robert, MD   10 mg at 03/24/24 0908   aspirin  EC tablet 325 mg  325 mg Oral Daily Crawley, Gregory Robert, MD   325 mg at 03/24/24 0908   atorvastatin  (LIPITOR) tablet 20 mg  20 mg Oral QHS Crawley, Gregory Robert, MD   20 mg at 03/23/24 2106   bisacodyl  (DULCOLAX) suppository 10 mg  10 mg Rectal Daily PRN Jhonny Calvin B, MD       feeding supplement (GLUCERNA SHAKE) (GLUCERNA SHAKE) liquid 237 mL  237 mL Oral TID BM Sreenath, Sudheer B, MD   237 mL at 03/24/24 1035   fluticasone  (FLONASE ) 50 MCG/ACT nasal spray 1 spray  1 spray Each Nare QHS PRN Maryrose Cordella Charleston, MD       guaiFENesin  (ROBITUSSIN) 100 MG/5ML liquid  200 mg  200 mg Oral TID PRN Maryrose Cordella Charleston, MD       hydrALAZINE  (APRESOLINE ) injection 5 mg  5 mg Intravenous Q6H PRN Maryrose Cordella Charleston, MD       hydrALAZINE  (APRESOLINE ) tablet 25 mg  25 mg Oral BID Crawley, Gregory Robert, MD   25 mg at 03/24/24 9091   HYDROcodone -acetaminophen  (NORCO/VICODIN) 5-325 MG per tablet 1 tablet  1 tablet Oral Q6H PRN Maryrose Cordella Charleston, MD       insulin  aspart (novoLOG ) injection 0-5 Units  0-5 Units Subcutaneous QHS Maryrose Cordella Charleston, MD   4 Units at 03/23/24 2117   insulin  aspart (novoLOG ) injection 0-9 Units  0-9 Units Subcutaneous TID WC Maryrose Cordella Charleston, MD   2 Units at 03/24/24 0820   lisinopril  (ZESTRIL ) tablet 40 mg  40 mg Oral Daily Crawley, Gregory Robert, MD    40 mg at 03/24/24 9091   metoprolol  tartrate (LOPRESSOR ) tablet 25 mg  25 mg Oral BID Crawley, Gregory Robert, MD   25 mg at 03/24/24 0908   morphine  (PF) 2 MG/ML injection 1 mg  1 mg Intravenous Q2H PRN Crawley, Gregory Robert, MD       multivitamin with minerals tablet 1 tablet  1 tablet Oral Daily Sreenath, Sudheer B, MD   1 tablet at 03/24/24 1035   OLANZapine  (ZYPREXA ) tablet 2.5 mg  2.5 mg Oral QHS Crawley, Gregory Robert, MD   2.5 mg at 03/23/24 2108   polyethylene glycol (MIRALAX  / GLYCOLAX ) packet 17 g  17 g Oral Daily PRN Sreenath, Sudheer B, MD   17 g at 03/24/24 1035   senna-docusate (Senokot-S) tablet 1 tablet  1 tablet Oral BID Sreenath, Sudheer B, MD   1 tablet at 03/24/24 1035   thiamine  (VITAMIN B1) tablet 100 mg  100 mg Oral Daily Crawley, Gregory Robert, MD   100 mg at 03/24/24 0908     Discharge Medications: Please see discharge summary for a list of discharge medications.  Relevant Imaging Results:  Relevant Lab Results:   Additional Information SS #: [id number]  Tianah Lonardo E Bucky Grigg, LCSW

## 2024-03-24 NOTE — Discharge Instructions (Signed)
 ORTHOPEDIC DISCHARGE INSTRUCTIONS  Follow Up Appointment:  Follow-up in the office in 6 weeks, staples can be removed by SNF on 04/06/24.  Please contact the [facility] at [phone]  to schedule your follow-up appointment.  Dressing and cast care instructions:  After 48 hours, remove the large dressing and clean the incision with hydrogen peroxide.  Begin daily light, dry dressing changes.  Weight-bearing status:  You are weightbearing as tolerated on the LEFT LOWER EXTREMITY with your walker/crutches.   Diet:   You may resume your regular diet as tolerated.  Begin with clear liquids and slowly advance to your normal diet.  Medications:   Take your medicines as prescribed. You may have written prescriptions or your prescriptions may have been E-prescribed to your pharmacy. If you were given prescriptions for any blood thinners, it is a very important that you take these medications as prescribed to prevent blood clots.  General instructions:  Elevate the affected extremity to control pain and swelling. Apply ice packs to the affected area to control pain and swelling.  Surgery and pain medications may lead to constipation. It is easier to prevent this than it is to treat it. We recommend MiraLAX  or Senna/Senakot for laxatives and Colace as a stool softener as needed after surgery. Drink plenty of fluids to stay well-hydrated. Consult your local pharmacist for other treatment recommendations.   Please perform deep breathing exercises every hour to increase the airflow in your lungs which could predispose you to running a low-grade fever and increase your risk of pneumonia.

## 2024-03-24 NOTE — Progress Notes (Signed)
 Physical Therapy Treatment Patient Details Name: Calvin Bates MRN: [id number] DOB: Oct 26, 1950 Today's Date: 03/24/2024   History of Present Illness Patient is a 73 year old male with ground level fall fracturing left hip. S/p left hip hemiarthroplasty, posterior approach. PMH: HTN, HLD, stroke, DM, schizophrenia, tardive dyskinesia.    PT Comments  Patient is agreeable to PT session and wants to return to bed. Patient required Max A for step pivot transfer from chair to bed. Difficulty with standing with weight shifting facilitation provided and cues for technique. Standing activity limited by fatigue. Recommend to continue PT to maximize independence and facilitate return to prior level of function. Rehabilitation < 3 hours/day recommended after this hospital stay.    If plan is discharge home, recommend the following: Two people to help with walking and/or transfers;A lot of help with bathing/dressing/bathroom;Assistance with cooking/housework;Help with stairs or ramp for entrance;Supervision due to cognitive status   Can travel by private vehicle     No  Equipment Recommendations  None recommended by PT    Recommendations for Other Services       Precautions / Restrictions Precautions Precautions: Posterior Hip;Fall Precaution Booklet Issued: No Recall of Precautions/Restrictions: Impaired Restrictions Weight Bearing Restrictions Per Provider Order: Yes LLE Weight Bearing Per Provider Order: Weight bearing as tolerated     Mobility  Bed Mobility Overal bed mobility: Needs Assistance Bed Mobility: Sit to Supine     Supine to sit: Max assist, +2 for physical assistance Sit to supine: Max assist   General bed mobility comments: assistance for BLE and trunk support. cues for technique, increased time required    Transfers Overall transfer level: Needs assistance Equipment used: Rolling walker (2 wheels) Transfers: Bed to chair/wheelchair/BSC Sit to Stand: Max assist, +2  physical assistance, From elevated surface   Step pivot transfers: Max assist       General transfer comment: significant increased time. weight shifting faciliation provided for step pivot technique.    Ambulation/Gait               General Gait Details: unable to at this time   Stairs             Wheelchair Mobility     Tilt Bed    Modified Rankin (Stroke Patients Only)       Balance Overall balance assessment: Needs assistance Sitting-balance support: Feet supported Sitting balance-Leahy Scale: Fair Sitting balance - Comments: occasional right lean with sitting, question if off loading due to pain, although patient only reports 1/10 pain in the left hip Postural control: Right lateral lean Standing balance support: Bilateral upper extremity supported, Reliant on assistive device for balance Standing balance-Leahy Scale: Poor Standing balance comment: external support required with heavy reliance on rolling walker for support. limited standing tolerance                            Communication Communication Communication: No apparent difficulties Factors Affecting Communication:  (noted tardive dyskinesia)  Cognition Arousal: Alert Behavior During Therapy: WFL for tasks assessed/performed   PT - Cognitive impairments: No family/caregiver present to determine baseline                       PT - Cognition Comments: patient is grossly oriented, cooperative throughout. Following commands: Impaired Following commands impaired: Follows one step commands with increased time    Cueing Cueing Techniques: Verbal cues, Visual cues  Exercises  General Comments General comments (skin integrity, edema, etc.): dizziness reported with position changes, resolved with time      Pertinent Vitals/Pain Pain Assessment Pain Assessment: Faces Pain Score: 1  Faces Pain Scale: Hurts a little bit Pain Location: L hip Pain Descriptors /  Indicators: Discomfort Pain Intervention(s): Limited activity within patient's tolerance, Monitored during session, Repositioned, Ice applied    Home Living Family/patient expects to be discharged to:: Group home     Type of Home: House Home Access: Ramped entrance       Home Layout: One level Home Equipment: Agricultural consultant (2 wheels) Additional Comments: patient reports he has lived in the group home for 14 years.    Prior Function            PT Goals (current goals can now be found in the care plan section) Acute Rehab PT Goals Patient Stated Goal: rehab PT Goal Formulation: With patient Time For Goal Achievement: 04/07/24 Potential to Achieve Goals: Good Progress towards PT goals: Progressing toward goals    Frequency    Min 2X/week      PT Plan      Co-evaluation PT/OT/SLP Co-Evaluation/Treatment: Yes Reason for Co-Treatment: To address functional/ADL transfers PT goals addressed during session: Mobility/safety with mobility OT goals addressed during session: ADL's and self-care      AM-PAC PT 6 Clicks Mobility   Outcome Measure  Help needed turning from your back to your side while in a flat bed without using bedrails?: A Lot Help needed moving from lying on your back to sitting on the side of a flat bed without using bedrails?: A Lot Help needed moving to and from a bed to a chair (including a wheelchair)?: Total Help needed standing up from a chair using your arms (e.g., wheelchair or bedside chair)?: Total Help needed to walk in hospital room?: Total Help needed climbing 3-5 steps with a railing? : Total 6 Click Score: 8    End of Session Equipment Utilized During Treatment: Gait belt Activity Tolerance: Patient tolerated treatment well Patient left: in bed;with call bell/phone within reach;with bed alarm set Nurse Communication: Mobility status PT Visit Diagnosis: Difficulty in walking, not elsewhere classified (R26.2);Other abnormalities of  gait and mobility (R26.89)     Time: 1300-1313 PT Time Calculation (min) (ACUTE ONLY): 13 min  Charges:    $Therapeutic Activity: 8-22 mins PT General Charges $$ ACUTE PT VISIT: 1 Visit                     Calvin Bates, PT, MPT    Calvin Bates 03/24/2024, 1:42 PM

## 2024-03-24 NOTE — Progress Notes (Signed)
 PROGRESS NOTE    Calvin Bates  FMW:[id number] DOB: February 24, 1951 DOA: 03/22/2024 PCP: Sampson Ethridge LABOR, MD    Brief Narrative:   73 year old male with history of hypertension, hyperlipidemia, history of stroke, non-insulin -dependent diabetes mellitus, schizophrenia, with history of tardive dyskinesia, who presents ED for chief concerns of leg giving out and landing on his left side.   He reports that earlier this morning he was walking to the restroom when he felt his leg gave out on him and he fell on his left side.     He denies head trauma, loss of consciousness, chest pain, abdominal pain, dysuria, hematuria, shortness of breath, nausea, vomiting, fever, chills.   He endorses decreased range of motion of the left lower extremity.  On exam, it was noted that he had right calf swelling and patient was not able to tell me how long this has been going on, but he states this may be a new thing.  There is also right calf tenderness with palpation.   Assessment & Plan:   Principal Problem:   Closed left femoral fracture (HCC) Active Problems:   Diabetes mellitus type 2, noninsulin dependent (HCC)   Essential hypertension   History of stroke   Pain and swelling of right lower extremity   Hyperlipidemia   Schizophrenia (HCC)  Closed left femoral fracture (HCC) Orthopedics on board No evidence of syncopal event Status post left hip hemiarthroplasty 9/6.  Tolerated well Plan: Pain control VTE prophylaxis PT OT mobilize as tolerated Will need skilled nursing facility Bowel regimen   Pain and swelling of right lower extremity Vascular ultrasound negative Pain control as above   History of stroke No acute issues Continue statin   Essential hypertension Continue home hydralazine , amlodipine , lisinopril , clonidine   Diabetes mellitus type 2, noninsulin dependent (HCC) Hold oral agents SSI for now On carb modified diet   Schizophrenia (HCC) With known fasciculations of  the tongue Continue home Zyprexa    Hyperlipidemia Continue Home statin    DVT prophylaxis: On hold Code Status: Full Family Communication: None Disposition Plan: Status is: Inpatient Remains inpatient appropriate because: Hip fracture    Level of care: Telemetry Surgical  Consultants:  Orthopedics  Procedures:  Hip fracture repair  Antimicrobials: None   Subjective: Seen and examined.  Sitting up in chair.  Pain well-controlled.  Objective: Vitals:   03/23/24 2345 03/24/24 0450 03/24/24 0724 03/24/24 0807  BP: (!) 147/59 138/64 (!) 172/64 (!) 164/68  Pulse: 63 68 60   Resp: 18 18 19    Temp: 98.1 F (36.7 C) 98.2 F (36.8 C) 97.6 F (36.4 C)   TempSrc:   Oral   SpO2: 100% 94% 94%   Weight:      Height:        Intake/Output Summary (Last 24 hours) at 03/24/2024 1119 Last data filed at 03/24/2024 0600 Gross per 24 hour  Intake 1480 ml  Output 1275 ml  Net 205 ml   Filed Weights   03/22/24 1129  Weight: 65 kg    Examination:  General exam: NAD Respiratory system: Lungs clear.  Normal work of breathing.  Room air Cardiovascular system: S1-S2, RRR, no murmurs, no pedal edema Gastrointestinal system:, Distention, normal bowel sounds Central nervous system: Alert and oriented. No focal neurological deficits.  Tongue fasciculations Extremities: Left hip surgical dressing CDI Skin: No rashes, lesions or ulcers Psychiatry: Judgement and insight appear normal. Mood & affect flattened.     Data Reviewed: I have personally reviewed following labs and  imaging studies  CBC: Recent Labs  Lab 03/22/24 1232 03/23/24 0939  WBC 14.4* 11.7*  NEUTROABS 12.8* 10.0*  HGB 11.5* 10.7*  HCT 35.0* 32.7*  MCV 90.0 89.6  PLT 207 184   Basic Metabolic Panel: Recent Labs  Lab 03/22/24 1232 03/23/24 0939  NA 139 137  K 3.7 4.0  CL 104 106  CO2 24 23  GLUCOSE 137* 124*  BUN 34* 36*  CREATININE 1.34* 1.52*  CALCIUM  9.1 8.7*   GFR: Estimated Creatinine  Clearance: 39.8 mL/min (A) (by C-G formula based on SCr of 1.52 mg/dL (H)). Liver Function Tests: Recent Labs  Lab 03/22/24 1232  AST 33  ALT 28  ALKPHOS 59  BILITOT 0.8  PROT 6.1*  ALBUMIN 3.5   No results for input(s): LIPASE, AMYLASE in the last 168 hours. No results for input(s): AMMONIA in the last 168 hours. Coagulation Profile: Recent Labs  Lab 03/23/24 0939  INR 1.3*   Cardiac Enzymes: No results for input(s): CKTOTAL, CKMB, CKMBINDEX, TROPONINI in the last 168 hours. BNP (last 3 results) No results for input(s): PROBNP in the last 8760 hours. HbA1C: No results for input(s): HGBA1C in the last 72 hours. CBG: Recent Labs  Lab 03/23/24 1345 03/23/24 1657 03/23/24 2111 03/24/24 0746 03/24/24 0801  GLUCAP 174* 179* 315* 189* 176*   Lipid Profile: No results for input(s): CHOL, HDL, LDLCALC, TRIG, CHOLHDL, LDLDIRECT in the last 72 hours. Thyroid Function Tests: No results for input(s): TSH, T4TOTAL, FREET4, T3FREE, THYROIDAB in the last 72 hours. Anemia Panel: No results for input(s): VITAMINB12, FOLATE, FERRITIN, TIBC, IRON, RETICCTPCT in the last 72 hours. Sepsis Labs: No results for input(s): PROCALCITON, LATICACIDVEN in the last 168 hours.  Recent Results (from the past 240 hours)  MRSA Next Gen by PCR, Nasal     Status: None   Collection Time: 03/23/24  3:48 AM   Specimen: Nasal Mucosa; Nasal Swab  Result Value Ref Range Status   MRSA by PCR Next Gen NOT DETECTED NOT DETECTED Final    Comment: (NOTE) The GeneXpert MRSA Assay (FDA approved for NASAL specimens only), is one component of a comprehensive MRSA colonization surveillance program. It is not intended to diagnose MRSA infection nor to guide or monitor treatment for MRSA infections. Test performance is not FDA approved in patients less than 41 years old. Performed at [facility], [address] [place], [place] [address]           Radiology Studies: DG HIP PORT UNILAT WITH PELVIS 1V LEFT Result Date: 03/23/2024 CLINICAL DATA:  Proximal left femoral fracture. EXAM: DG HIP (WITH OR WITHOUT PELVIS) 1V PORT LEFT COMPARISON:  March 22, 2024. FINDINGS: Status post left hip arthroplasty for treatment of proximal femoral fracture. Prosthesis appears to be well situated. Expected postoperative changes are noted in the surrounding soft tissues. IMPRESSION: Status post left hip arthroplasty. Electronically Signed   By: Lynwood Landy Raddle M.D.   On: 03/23/2024 14:32   US  Venous Img Lower Unilateral Right (DVT) Result Date: 03/22/2024 CLINICAL DATA:  Right lower extremity swelling EXAM: RIGHT LOWER EXTREMITY VENOUS DOPPLER ULTRASOUND TECHNIQUE: Gray-scale sonography with compression, as well as color and duplex ultrasound, were performed to evaluate the deep venous system(s) from the level of the common femoral vein through the popliteal and proximal calf veins. COMPARISON:  None Available. FINDINGS: VENOUS Normal compressibility of the common femoral, superficial femoral, and popliteal veins, as well as the visualized calf veins. Visualized portions of profunda femoral vein and great  saphenous vein unremarkable. No filling defects to suggest DVT on grayscale or color Doppler imaging. Doppler waveforms show normal direction of venous flow, normal respiratory plasticity and response to augmentation. Limited views of the contralateral common femoral vein are unremarkable. OTHER None. Limitations: none IMPRESSION: 1. No evidence of deep venous thrombosis within the right lower extremity. Electronically Signed   By: Ozell Daring M.D.   On: 03/22/2024 16:01   DG Chest Portable 1 View Result Date: 03/22/2024 CLINICAL DATA:  Recent fall sent for medical clearance. EXAM: PORTABLE CHEST 1 VIEW COMPARISON:  June 24, 2023 FINDINGS: The heart size and mediastinal contours are within normal limits. Stable diffuse chronic appearing increased  interstitial lung markings are seen. There is no evidence of focal consolidation, pleural effusion or pneumothorax. There are multiple chronic bilateral rib fractures. A chronic fracture of the right humeral head and neck is also noted. Multilevel degenerative changes are seen throughout the thoracic spine. IMPRESSION: Chronic appearing increased interstitial lung markings without evidence of acute or active cardiopulmonary disease. Electronically Signed   By: Suzen Dials M.D.   On: 03/22/2024 13:16   DG Hip Unilat W or Wo Pelvis 2-3 Views Left Result Date: 03/22/2024 CLINICAL DATA:  Status post fall. EXAM: DG HIP (WITH OR WITHOUT PELVIS) 2-3V LEFT COMPARISON:  None Available. FINDINGS: An acute, impacted fracture deformity is seen involving the head and neck of the proximal left femur. A fracture deformity of indeterminate age is also seen involving the right inferior pubic ramus. There is no evidence of dislocation. Degenerative changes seen involving the left hip in the form of joint space narrowing and acetabular sclerosis. IMPRESSION: 1. Acute fracture of the proximal left femur. 2. Fracture of the right inferior pubic ramus of indeterminate age. Electronically Signed   By: Suzen Dials M.D.   On: 03/22/2024 12:17        Scheduled Meds:  amLODipine   10 mg Oral Daily   aspirin  EC  325 mg Oral Daily   atorvastatin   20 mg Oral QHS   feeding supplement (GLUCERNA SHAKE)  237 mL Oral TID BM   hydrALAZINE   25 mg Oral BID   insulin  aspart  0-5 Units Subcutaneous QHS   insulin  aspart  0-9 Units Subcutaneous TID WC   lisinopril   40 mg Oral Daily   metoprolol  tartrate  25 mg Oral BID   multivitamin with minerals  1 tablet Oral Daily   OLANZapine   2.5 mg Oral QHS   senna-docusate  1 tablet Oral BID   thiamine   100 mg Oral Daily   Continuous Infusions:   LOS: 2 days   Calvin KATHEE Robson, MD [facility]   If 7PM-7AM, please contact night-coverage  03/24/2024, 11:19 AM

## 2024-03-24 NOTE — Progress Notes (Signed)
 ORTHOPEDIC PROGRESS NOTE  SUBJECTIVE:     The patient is alert and oriented x 3. The patient is resting quietly in bed. The patient reports their pain is controlled with medications.  OBJECTIVE:  Vitals:   03/24/24 0807 03/24/24 1145  BP: (!) 164/68 (!) 145/68  Pulse:  66  Resp:  19  Temp:  98.6 F (37 C)  SpO2:  95%    The patient's dressing is clean, dry, and intact. The patient's bilateral lower extremities are neurovascularly intact.  Their toes are pink and warm with brisk capillary refill time.  Dorsalis pedis and posterior tibial pulse are 2+ and symmetric. The patient's calves are soft, nontender, with a negative Homans test bilaterally. The patient is making slow progress with physical therapy as expected based on their overall medical condition.  Labs:  Recent Labs    03/22/24 1232 03/23/24 0939  HGB 11.5* 10.7*   Recent Labs    03/22/24 1232 03/23/24 0939  WBC 14.4* 11.7*  RBC 3.89* 3.65*  HCT 35.0* 32.7*  PLT 207 184   Recent Labs    03/22/24 1232 03/23/24 0939  NA 139 137  K 3.7 4.0  CL 104 106  CO2 24 23  BUN 34* 36*  CREATININE 1.34* 1.52*  GLUCOSE 137* 124*  CALCIUM  9.1 8.7*   Recent Labs    03/23/24 0939  INR 1.3*      ASSESSMENT:  1 Day Post-Op  Status Post: HEMIARTHROPLASTY (BIPOLAR) HIP, POSTERIOR APPROACH FOR FRACTURE: 72763 (CPT)    The patient's orthopedic condition is stable and they continue to improve daily. The patient is making slow progress as expected based on their overall medical condition. Expected acute postop blood loss anemia.   PLAN:  Continue PT to mobilize patient as tolerated. Continue DVT prophylaxis.   Cordella Lamar Knack M.D. 03/24/2024 3:43 PM

## 2024-03-24 NOTE — Evaluation (Signed)
 Physical Therapy Evaluation Patient Details Name: Calvin Bates MRN: [id number] DOB: 08/01/1950 Today's Date: 03/24/2024  History of Present Illness  Patient is a 73 year old male with ground level fall fracturing left hip. S/p left hip hemiarthroplasty, posterior approach. PMH: HTN, HLD, stroke, DM, schizophrenia, tardive dyskinesia.  Clinical Impression  Patient is agreeable to PT evaluation. He lives in a group home where he was ambulatory with a rolling walker. He required no assistance with mobility but did have help with medication management and meals. He reports history of numerous falls.  Today the patient required +2 person assistance with mobility. Standing tolerance limited for progression of ambulation at this time. Pain reported at 1/10 in the left hip with mobility. He is not at his baseline level of functional independence. Recommend to continue PT to maximize independence and facilitate return to prior level of function. Rehabilitation < 3 hours/day recommended after this hospital stay.     If plan is discharge home, recommend the following: Two people to help with walking and/or transfers;A lot of help with bathing/dressing/bathroom;Assistance with cooking/housework;Help with stairs or ramp for entrance;Supervision due to cognitive status   Can travel by private vehicle   No    Equipment Recommendations None recommended by PT  Recommendations for Other Services       Functional Status Assessment Patient has had a recent decline in their functional status and demonstrates the ability to make significant improvements in function in a reasonable and predictable amount of time.     Precautions / Restrictions Precautions Precautions: Posterior Hip;Fall Restrictions Weight Bearing Restrictions Per Provider Order: Yes LLE Weight Bearing Per Provider Order: Weight bearing as tolerated      Mobility  Bed Mobility Overal bed mobility: Needs Assistance Bed Mobility: Supine  to Sit     Supine to sit: Max assist, +2 for physical assistance     General bed mobility comments: assistance for LLE support and for trunk. cues for technique    Transfers Overall transfer level: Needs assistance Equipment used: Rolling walker (2 wheels) Transfers: Sit to/from Stand, Bed to chair/wheelchair/BSC Sit to Stand: Max assist, +2 physical assistance, From elevated surface   Step pivot transfers: Max assist, +2 physical assistance       General transfer comment: cues for technique    Ambulation/Gait               General Gait Details: unable to take steps at this time  Stairs            Wheelchair Mobility     Tilt Bed    Modified Rankin (Stroke Patients Only)       Balance Overall balance assessment: Needs assistance Sitting-balance support: Feet supported Sitting balance-Leahy Scale: Fair Sitting balance - Comments: occasional right lean with sitting, question if off loading due to pain, although patient only reports 1/10 pain in the left hip Postural control: Right lateral lean Standing balance support: Bilateral upper extremity supported, Reliant on assistive device for balance Standing balance-Leahy Scale: Poor Standing balance comment: external support required with heavy reliance on rolling wlker ofr support. limited standing tolerance                             Pertinent Vitals/Pain Pain Assessment Pain Assessment: 0-10 Pain Score: 1  Pain Location: L hip Pain Descriptors / Indicators: Discomfort Pain Intervention(s): Limited activity within patient's tolerance, Monitored during session, Repositioned, Ice applied    Home Living  Family/patient expects to be discharged to:: Group home     Type of Home: House Home Access: Ramped entrance       Home Layout: One level Home Equipment: Agricultural consultant (2 wheels) Additional Comments: patient reports he has lived in the group home for 14 years.    Prior Function  Prior Level of Function : Needs assist;History of Falls (last six months)             Mobility Comments: ambulation with rolling walker. multiple fall reported ADLs Comments: assistance for medication management and meals.     Extremity/Trunk Assessment   Upper Extremity Assessment Upper Extremity Assessment: Defer to OT evaluation    Lower Extremity Assessment Lower Extremity Assessment: LLE deficits/detail LLE Deficits / Details: patient able to complete SLR. limited weight bearing with standing  due to pain in the hip       Communication   Communication Communication: No apparent difficulties Factors Affecting Communication:  (noted tardive dyskinesia- like movements of mouth throughout session)    Cognition Arousal: Alert Behavior During Therapy: WFL for tasks assessed/performed   PT - Cognitive impairments: No family/caregiver present to determine baseline                       PT - Cognition Comments: patient is grossly oriented, cooperative throughout. Following commands: Impaired Following commands impaired: Follows one step commands with increased time     Cueing Cueing Techniques: Verbal cues, Visual cues     General Comments General comments (skin integrity, edema, etc.): mild dizziness initially with sitting that improves with increased sitting time. educated patient on posterior hip precuations and he was able to recall 1/3    Exercises     Assessment/Plan    PT Assessment Patient needs continued PT services  PT Problem List Decreased strength;Decreased range of motion;Decreased activity tolerance;Decreased mobility;Decreased balance;Pain;Decreased knowledge of precautions       PT Treatment Interventions Gait training;DME instruction;Stair training;Therapeutic activities;Functional mobility training;Therapeutic exercise;Balance training;Neuromuscular re-education;Cognitive remediation;Patient/family education    PT Goals (Current goals  can be found in the Care Plan section)  Acute Rehab PT Goals Patient Stated Goal: rehab PT Goal Formulation: With patient Time For Goal Achievement: 04/07/24 Potential to Achieve Goals: Good    Frequency Min 2X/week     Co-evaluation PT/OT/SLP Co-Evaluation/Treatment: Yes Reason for Co-Treatment: To address functional/ADL transfers PT goals addressed during session: Mobility/safety with mobility         AM-PAC PT 6 Clicks Mobility  Outcome Measure Help needed turning from your back to your side while in a flat bed without using bedrails?: A Lot Help needed moving from lying on your back to sitting on the side of a flat bed without using bedrails?: Total Help needed moving to and from a bed to a chair (including a wheelchair)?: Total Help needed standing up from a chair using your arms (e.g., wheelchair or bedside chair)?: Total Help needed to walk in hospital room?: Total Help needed climbing 3-5 steps with a railing? : Total 6 Click Score: 7    End of Session Equipment Utilized During Treatment: Gait belt Activity Tolerance: Patient tolerated treatment well Patient left: in chair;with call bell/phone within reach;with chair alarm set (ice pack L hip) Nurse Communication: Mobility status PT Visit Diagnosis: Difficulty in walking, not elsewhere classified (R26.2);Other abnormalities of gait and mobility (R26.89)    Time: 9162-9094 PT Time Calculation (min) (ACUTE ONLY): 28 min   Charges:   PT Evaluation $PT Eval  Moderate Complexity: 1 Mod PT Treatments $Therapeutic Activity: 8-22 mins PT General Charges $$ ACUTE PT VISIT: 1 Visit         Randine Essex, PT, MPT   [name] 03/24/2024, 10:21 AM

## 2024-03-24 NOTE — Plan of Care (Signed)
   Problem: Coping: Goal: Ability to adjust to condition or change in health will improve Outcome: Progressing

## 2024-03-24 NOTE — Plan of Care (Signed)
  Problem: Nutritional: Goal: Maintenance of adequate nutrition will improve Outcome: Progressing   Problem: Skin Integrity: Goal: Risk for impaired skin integrity will decrease Outcome: Progressing   Problem: Clinical Measurements: Goal: Will remain free from infection Outcome: Progressing   Problem: Activity: Goal: Risk for activity intolerance will decrease Outcome: Progressing   Problem: Coping: Goal: Level of anxiety will decrease Outcome: Progressing   Problem: Pain Managment: Goal: General experience of comfort will improve and/or be controlled Outcome: Progressing

## 2024-03-24 NOTE — TOC Progression Note (Signed)
 Transition of Care [facility]) - Progression Note    Patient Details  Name: Calvin Bates MRN: [id number] Date of Birth: September 14, 1950  [facility] of [facility]) CM/SW Contact  Philana Younis E Prajna Vanderpool, LCSW Phone Number: 03/24/2024, 10:59 AM  Clinical Narrative:    SNF workup started. PASRR is pending - updated ICM handoff.  Expected Discharge Plan: Group Home Barriers to Discharge: Continued Medical Work up               Expected Discharge Plan and Services       Living arrangements for the past 2 months: Group Home                                       Social Drivers of Health (SDOH) Interventions SDOH Screenings   Food Insecurity: No Food Insecurity (03/22/2024)  Housing: Low Risk  (03/22/2024)  Transportation Needs: No Transportation Needs (03/22/2024)  Utilities: Not At Risk (03/22/2024)  Depression (PHQ2-9): Low Risk  (07/28/2022)  Social Connections: Unknown (03/22/2024)  Tobacco Use: Low Risk  (03/23/2024)    Readmission Risk Interventions     No data to display

## 2024-03-24 NOTE — Progress Notes (Signed)
 Initial Nutrition Assessment  DOCUMENTATION CODES:   Morbid obesity  INTERVENTION:   -Carb modified diet -Glucerna Shake po TID, each supplement provides 220 kcal and 10 grams of protein  -MVI with minerals daily  NUTRITION DIAGNOSIS:   Increased nutrient needs related to post-op healing as evidenced by estimated needs.  GOAL:   Patient will meet greater than or equal to 90% of their needs  MONITOR:   PO intake, Supplement acceptance  REASON FOR ASSESSMENT:   Consult Assessment of nutrition requirement/status, Hip fracture protocol  ASSESSMENT:   Pt with history of hypertension, hyperlipidemia, history of stroke, non-insulin -dependent diabetes mellitus, schizophrenia, with history of tardive dyskinesia, who presents for chief concerns of leg giving out and landing on his left side.  Pt admitted with closed lt femur fracture.   9/6- s/p HEMIARTHROPLASTY (BIPOLAR) HIP, POSTERIOR APPROACH FOR FRACTURE: [phone] (CPT)   Reviewed I/O's: +2-5 ml x 24 hours and -145 ml since admission  UOP: 1.2 L x 24 hours  Pt unavailable at time of visit. Attempted to speak with pt via call to hospital room phone, however, unable to reach. RD unable to obtain further nutrition-related history or complete nutrition-focused physical exam at this time.    Per TOC notes, pt is from [facility]. Pt can return when medically ready, but may require short term rehab.   Pt on a regular diet. Noted good appetite, consuming 85% of meals.   Reviewed wt hx; pt has experienced a 17.4% wt loss over the past 2 months, which is significant for time frame. Per nursing assessment, pt with lower extremity edema which may be masking true weight loss as well as fat and muscle depletions.  Highly suspect malnutrition secondary to significant weight loss,however, unable to identify at this time. Pt would greatly benefit from addition of oral nutrition supplements.  Medications reviewed and include  colace and thiamine .  No results found for: HGBA1C PTA DM medications are 500 mg metformin BID.   Labs reviewed: CBGS: 179-315 (inpatient orders for glycemic control are 0-5 units insulin  aspart daily at bedtime and 0-9 units insulin  aspart TID with meals).    Diet Order:   Diet Order             Diet regular Room service appropriate? Yes; Fluid consistency: Thin  Diet effective now                   EDUCATION NEEDS:   No education needs have been identified at this time  Skin:  Skin Assessment: Skin Integrity Issues: Skin Integrity Issues:: Incisions Incisions: closed lt hip  Last BM:  03/21/24  Height:   Ht Readings from Last 1 Encounters:  03/22/24 5' 9 (1.753 m)    Weight:   Wt Readings from Last 1 Encounters:  03/22/24 65 kg    Ideal Body Weight:  72.7 kg  BMI:  Body mass index is 21.16 kg/m.  Estimated Nutritional Needs:   Kcal:  1900-2100  Protein:  100-115 grams  Fluid:  1.9-2,1 L    Margery ORN, RD, LDN, CDCES Registered Dietitian III Certified Diabetes Care and Education Specialist If unable to reach this RD, please use RD Inpatient group chat on secure chat between hours of 8am-4 pm daily

## 2024-03-25 ENCOUNTER — Encounter: Payer: Self-pay | Admitting: Orthopedic Surgery

## 2024-03-25 ENCOUNTER — Inpatient Hospital Stay

## 2024-03-25 DIAGNOSIS — S72022A Displaced fracture of epiphysis (separation) (upper) of left femur, initial encounter for closed fracture: Secondary | ICD-10-CM | POA: Diagnosis not present

## 2024-03-25 DIAGNOSIS — E440 Moderate protein-calorie malnutrition: Secondary | ICD-10-CM | POA: Insufficient documentation

## 2024-03-25 LAB — CBC WITH DIFFERENTIAL/PLATELET
Abs Immature Granulocytes: 0.07 K/uL (ref 0.00–0.07)
Basophils Absolute: 0.1 K/uL (ref 0.0–0.1)
Basophils Relative: 1 %
Eosinophils Absolute: 0.6 K/uL — ABNORMAL HIGH (ref 0.0–0.5)
Eosinophils Relative: 7 %
HCT: 29.3 % — ABNORMAL LOW (ref 39.0–52.0)
Hemoglobin: 9.7 g/dL — ABNORMAL LOW (ref 13.0–17.0)
Immature Granulocytes: 1 %
Lymphocytes Relative: 10 %
Lymphs Abs: 0.9 K/uL (ref 0.7–4.0)
MCH: 29.8 pg (ref 26.0–34.0)
MCHC: 33.1 g/dL (ref 30.0–36.0)
MCV: 90.2 fL (ref 80.0–100.0)
Monocytes Absolute: 0.8 K/uL (ref 0.1–1.0)
Monocytes Relative: 9 %
Neutro Abs: 6.5 K/uL (ref 1.7–7.7)
Neutrophils Relative %: 72 %
Platelets: 187 K/uL (ref 150–400)
RBC: 3.25 MIL/uL — ABNORMAL LOW (ref 4.22–5.81)
RDW: 13.6 % (ref 11.5–15.5)
WBC: 9 K/uL (ref 4.0–10.5)
nRBC: 0 % (ref 0.0–0.2)

## 2024-03-25 LAB — BASIC METABOLIC PANEL WITH GFR
Anion gap: 10 (ref 5–15)
BUN: 52 mg/dL — ABNORMAL HIGH (ref 8–23)
CO2: 20 mmol/L — ABNORMAL LOW (ref 22–32)
Calcium: 7.6 mg/dL — ABNORMAL LOW (ref 8.9–10.3)
Chloride: 93 mmol/L — ABNORMAL LOW (ref 98–111)
Creatinine, Ser: 1.67 mg/dL — ABNORMAL HIGH (ref 0.61–1.24)
GFR, Estimated: 43 mL/min — ABNORMAL LOW (ref >60)
Glucose, Bld: 150 mg/dL — ABNORMAL HIGH (ref 70–99)
Potassium: 4.6 mmol/L (ref 3.5–5.1)
Sodium: 134 mmol/L — ABNORMAL LOW (ref 135–145)

## 2024-03-25 LAB — GLUCOSE, CAPILLARY
Glucose-Capillary: 136 mg/dL — ABNORMAL HIGH (ref 70–99)
Glucose-Capillary: 206 mg/dL — ABNORMAL HIGH (ref 70–99)
Glucose-Capillary: 149 mg/dL — ABNORMAL HIGH (ref 70–99)
Glucose-Capillary: 148 mg/dL — ABNORMAL HIGH (ref 70–99)

## 2024-03-25 LAB — HEMOGLOBIN A1C
Hgb A1c MFr Bld: 6.1 % — ABNORMAL HIGH (ref 4.8–5.6)
Mean Plasma Glucose: 128 mg/dL

## 2024-03-25 NOTE — Progress Notes (Signed)
 PROGRESS NOTE    Calvin Bates  FMW:[id number] DOB: June 24, 1951 DOA: 03/22/2024 PCP: Sampson Ethridge LABOR, MD    Brief Narrative:   73 year old male with history of hypertension, hyperlipidemia, history of stroke, non-insulin -dependent diabetes mellitus, schizophrenia, with history of tardive dyskinesia, who presents ED for chief concerns of leg giving out and landing on his left side.   He reports that earlier this morning he was walking to the restroom when he felt his leg gave out on him and he fell on his left side.     He denies head trauma, loss of consciousness, chest pain, abdominal pain, dysuria, hematuria, shortness of breath, nausea, vomiting, fever, chills.   He endorses decreased range of motion of the left lower extremity.  On exam, it was noted that he had right calf swelling and patient was not able to tell me how long this has been going on, but he states this may be a new thing.  There is also right calf tenderness with palpation.   Assessment & Plan:   Principal Problem:   Closed left femoral fracture (HCC) Active Problems:   Diabetes mellitus type 2, noninsulin dependent (HCC)   Essential hypertension   History of stroke   Pain and swelling of right lower extremity   Hyperlipidemia   Schizophrenia (HCC)   Malnutrition of moderate degree  Closed left femoral fracture (HCC) Orthopedics on board No evidence of syncopal event Status post left hip hemiarthroplasty 9/6.  Tolerated well Plan: Pain control VTE prophylaxis PT OT mobilize as tolerated Will need skilled nursing facility Escalate bowel regimen   Pain and swelling of right lower extremity Vascular ultrasound negative Pain control as above   History of stroke No acute issues Continue statin   Essential hypertension Continue home hydralazine , amlodipine , lisinopril , clonidine   Diabetes mellitus type 2, noninsulin dependent (HCC) Hold oral agents SSI for now On carb modified diet Sugars  controlled   Schizophrenia (HCC) With known fasciculations of the tongue Continue home Zyprexa    Hyperlipidemia Continue Home statin    DVT prophylaxis: SQ Lovenox  Code Status: Full Family Communication: None Disposition Plan: Status is: Inpatient Remains inpatient appropriate because: Hip fracture.  Discharge pending bowel movement   Level of care: Telemetry Surgical  Consultants:  Orthopedics  Procedures:  Hip fracture repair  Antimicrobials: None   Subjective: Seen and examined.  Sitting up in chair.  Pain well-controlled.  Objective: Vitals:   03/24/24 1916 03/24/24 2316 03/25/24 0355 03/25/24 0754  BP: (!) 146/66 (!) 117/55 (!) 154/66 (!) 160/69  Pulse: 80 78 66 69  Resp:   18 17  Temp: 98.7 F (37.1 C) 99.4 F (37.4 C) 99.1 F (37.3 C) 98.1 F (36.7 C)  TempSrc: Oral Oral    SpO2: 95% 96% 97% 97%  Weight:      Height:        Intake/Output Summary (Last 24 hours) at 03/25/2024 1254 Last data filed at 03/25/2024 1100 Gross per 24 hour  Intake 857 ml  Output 600 ml  Net 257 ml   Filed Weights   03/22/24 1129  Weight: 65 kg    Examination:  General exam: No acute distress Respiratory system: Lungs clear.  Normal work of breathing.  Room air Cardiovascular system: S1-S2, RRR, no murmurs, no pedal edema Gastrointestinal system:, Distention, normal bowel sounds Central nervous system: Alert and oriented.  No focal deficits.  Tongue fasciculations Extremities: Left hip surgical dressing CDI Skin: No rashes, lesions or ulcers Psychiatry: Judgement and insight  appear normal. Mood & affect flattened.     Data Reviewed: I have personally reviewed following labs and imaging studies  CBC: Recent Labs  Lab 03/22/24 1232 03/23/24 0939 03/25/24 1024  WBC 14.4* 11.7* 9.0  NEUTROABS 12.8* 10.0* 6.5  HGB 11.5* 10.7* 9.7*  HCT 35.0* 32.7* 29.3*  MCV 90.0 89.6 90.2  PLT 207 184 187   Basic Metabolic Panel: Recent Labs  Lab 03/22/24 1232  03/23/24 0939 03/25/24 1024  NA 139 137 134*  K 3.7 4.0 4.6  CL 104 106 93*  CO2 24 23 20*  GLUCOSE 137* 124* 150*  BUN 34* 36* 52*  CREATININE 1.34* 1.52* 1.67*  CALCIUM  9.1 8.7* 7.6*   GFR: Estimated Creatinine Clearance: 36.2 mL/min (A) (by C-G formula based on SCr of 1.67 mg/dL (H)). Liver Function Tests: Recent Labs  Lab 03/22/24 1232  AST 33  ALT 28  ALKPHOS 59  BILITOT 0.8  PROT 6.1*  ALBUMIN 3.5   No results for input(s): LIPASE, AMYLASE in the last 168 hours. No results for input(s): AMMONIA in the last 168 hours. Coagulation Profile: Recent Labs  Lab 03/23/24 0939  INR 1.3*   Cardiac Enzymes: No results for input(s): CKTOTAL, CKMB, CKMBINDEX, TROPONINI in the last 168 hours. BNP (last 3 results) No results for input(s): PROBNP in the last 8760 hours. HbA1C: No results for input(s): HGBA1C in the last 72 hours. CBG: Recent Labs  Lab 03/24/24 1127 03/24/24 1607 03/24/24 2150 03/25/24 0801 03/25/24 1213  GLUCAP 188* 201* 170* 136* 206*   Lipid Profile: No results for input(s): CHOL, HDL, LDLCALC, TRIG, CHOLHDL, LDLDIRECT in the last 72 hours. Thyroid Function Tests: No results for input(s): TSH, T4TOTAL, FREET4, T3FREE, THYROIDAB in the last 72 hours. Anemia Panel: No results for input(s): VITAMINB12, FOLATE, FERRITIN, TIBC, IRON, RETICCTPCT in the last 72 hours. Sepsis Labs: No results for input(s): PROCALCITON, LATICACIDVEN in the last 168 hours.  Recent Results (from the past 240 hours)  MRSA Next Gen by PCR, Nasal     Status: None   Collection Time: 03/23/24  3:48 AM   Specimen: Nasal Mucosa; Nasal Swab  Result Value Ref Range Status   MRSA by PCR Next Gen NOT DETECTED NOT DETECTED Final    Comment: (NOTE) The GeneXpert MRSA Assay (FDA approved for NASAL specimens only), is one component of a comprehensive MRSA colonization surveillance program. It is not intended to diagnose MRSA  infection nor to guide or monitor treatment for MRSA infections. Test performance is not FDA approved in patients less than 34 years old. Performed at [facility], [address], [place], [place] [address]          Radiology Studies: DG HIP PORT UNILAT WITH PELVIS 1V LEFT Result Date: 03/23/2024 CLINICAL DATA:  Proximal left femoral fracture. EXAM: DG HIP (WITH OR WITHOUT PELVIS) 1V PORT LEFT COMPARISON:  March 22, 2024. FINDINGS: Status post left hip arthroplasty for treatment of proximal femoral fracture. Prosthesis appears to be well situated. Expected postoperative changes are noted in the surrounding soft tissues. IMPRESSION: Status post left hip arthroplasty. Electronically Signed   By: Lynwood Landy Raddle M.D.   On: 03/23/2024 14:32        Scheduled Meds:  amLODipine   10 mg Oral Daily   aspirin  EC  325 mg Oral Daily   atorvastatin   20 mg Oral QHS   enoxaparin  (LOVENOX ) injection  40 mg Subcutaneous Q24H   feeding supplement (GLUCERNA SHAKE)  237 mL Oral TID BM  hydrALAZINE   25 mg Oral BID   insulin  aspart  0-5 Units Subcutaneous QHS   insulin  aspart  0-9 Units Subcutaneous TID WC   lisinopril   40 mg Oral Daily   metoprolol  tartrate  25 mg Oral BID   multivitamin with minerals  1 tablet Oral Daily   OLANZapine   2.5 mg Oral QHS   senna-docusate  1 tablet Oral BID   thiamine   100 mg Oral Daily   Continuous Infusions:   LOS: 3 days   Calvin KATHEE Robson, MD [facility]   If 7PM-7AM, please contact night-coverage  03/25/2024, 12:54 PM

## 2024-03-25 NOTE — Progress Notes (Addendum)
 Physical Therapy Treatment Patient Details Name: Calvin Bates MRN: [id number] DOB: Nov 10, 1950 Today's Date: 03/25/2024   History of Present Illness Patient is a 73 year old male with ground level fall fracturing left hip. S/p left hip hemiarthroplasty, posterior approach. PMH: HTN, HLD, stroke, DM, schizophrenia, tardive dyskinesia.    PT Comments  Patient seated in recliner on arrival and agreeable to PT/OT co-tx session. Required maxA+2 to stand from recliner x 3 with heavy forward posture and pushing posteriorly. In sitting, patient with heavy R lateral lean which progressed during session (fatigue?) with R sided UE weakness and neglect but unclear if new from baseline. RN and MD notified and in to assess. Discharge plan remains appropriate.     If plan is discharge home, recommend the following: Two people to help with walking and/or transfers;A lot of help with bathing/dressing/bathroom;Assistance with cooking/housework;Help with stairs or ramp for entrance;Supervision due to cognitive status   Can travel by private vehicle     No  Equipment Recommendations  None recommended by PT    Recommendations for Other Services       Precautions / Restrictions Precautions Precautions: Posterior Hip;Fall Precaution Booklet Issued: No Recall of Precautions/Restrictions: Impaired Restrictions Weight Bearing Restrictions Per Provider Order: Yes LLE Weight Bearing Per Provider Order: Weight bearing as tolerated     Mobility  Bed Mobility Overal bed mobility: Needs Assistance Bed Mobility: Sit to Supine       Sit to supine: Max assist, +2 for physical assistance        Transfers Overall transfer level: Needs assistance Equipment used: Rolling Carlin Attridge (2 wheels) Transfers: Sit to/from Stand Sit to Stand: Max assist, +2 physical assistance, From elevated surface           General transfer comment: significant posterior bias in standing and required maxA+2 to maintain standing     Ambulation/Gait                   Stairs             Wheelchair Mobility     Tilt Bed    Modified Rankin (Stroke Patients Only)       Balance Overall balance assessment: Needs assistance Sitting-balance support: Feet supported Sitting balance-Leahy Scale: Poor Sitting balance - Comments: heavy R lateral lean, progressively worse t/o session (fatigue?)   Standing balance support: Bilateral upper extremity supported, Reliant on assistive device for balance Standing balance-Leahy Scale: Poor                              Communication Communication Communication: No apparent difficulties  Cognition Arousal: Alert Behavior During Therapy: WFL for tasks assessed/performed   PT - Cognitive impairments: No family/caregiver present to determine baseline                       PT - Cognition Comments: patient is grossly oriented, cooperative throughout. Following commands: Impaired Following commands impaired: Follows one step commands with increased time    Cueing    Exercises Other Exercises Other Exercises: Seated LAQ x 5 with max cueing - each LE Other Exercises: Seated hip adduction pillow squeeze x 10 Other Exercises: Seated marching x 5 each LE    General Comments        Pertinent Vitals/Pain Pain Assessment Pain Assessment: Faces Faces Pain Scale: Hurts little more Pain Location: L hip Pain Descriptors / Indicators: Discomfort Pain Intervention(s): Limited activity within  patient's tolerance, Monitored during session, Repositioned    Home Living                          Prior Function            PT Goals (current goals can now be found in the care plan section) Acute Rehab PT Goals PT Goal Formulation: With patient Time For Goal Achievement: 04/07/24 Potential to Achieve Goals: Good Progress towards PT goals: Progressing toward goals    Frequency    Min 2X/week      PT Plan       Co-evaluation PT/OT/SLP Co-Evaluation/Treatment: Yes Reason for Co-Treatment: To address functional/ADL transfers PT goals addressed during session: Mobility/safety with mobility OT goals addressed during session: ADL's and self-care      AM-PAC PT 6 Clicks Mobility   Outcome Measure  Help needed turning from your back to your side while in a flat bed without using bedrails?: A Lot Help needed moving from lying on your back to sitting on the side of a flat bed without using bedrails?: A Lot Help needed moving to and from a bed to a chair (including a wheelchair)?: Total Help needed standing up from a chair using your arms (e.g., wheelchair or bedside chair)?: Total Help needed to walk in hospital room?: Total Help needed climbing 3-5 steps with a railing? : Total 6 Click Score: 8    End of Session Equipment Utilized During Treatment: Gait belt Activity Tolerance: Patient tolerated treatment well Patient left: in bed;with call bell/phone within reach;with bed alarm set Nurse Communication: Mobility status PT Visit Diagnosis: Difficulty in walking, not elsewhere classified (R26.2);Other abnormalities of gait and mobility (R26.89)     Time: 8880-8846 PT Time Calculation (min) (ACUTE ONLY): 34 min  Charges:    $Therapeutic Activity: 8-22 mins PT General Charges $$ ACUTE PT VISIT: 1 Visit                     Maryanne Finder, PT, DPT Physical Therapist - [facility]  [facility]    Elon Lomeli A Aneesha Holloran 03/25/2024, 2:11 PM

## 2024-03-25 NOTE — Care Management Important Message (Signed)
 Important Message  Patient Details  Name: Calvin Bates MRN: [id number] Date of Birth: [birth year]   Important Message Given:  Yes - Medicare IM     Thais Silberstein W, CMA 03/25/2024, 11:58 AM

## 2024-03-25 NOTE — Progress Notes (Signed)
 Nutrition Follow-up  DOCUMENTATION CODES:   Non-severe (moderate) malnutrition in context of social or environmental circumstances  INTERVENTION:   -Continue Carb modified diet -Continue Glucerna Shake po TID, each supplement provides 220 kcal and 10 grams of protein  -Continue MVI with minerals daily  NUTRITION DIAGNOSIS:   Moderate Malnutrition related to social / environmental circumstances as evidenced by mild fat depletion, moderate fat depletion, mild muscle depletion, moderate muscle depletion, percent weight loss.  Ongoing  GOAL:   Patient will meet greater than or equal to 90% of their needs  Progressing   MONITOR:   PO intake, Supplement acceptance  REASON FOR ASSESSMENT:   Consult Assessment of nutrition requirement/status, Hip fracture protocol  ASSESSMENT:   Pt with history of hypertension, hyperlipidemia, history of stroke, non-insulin -dependent diabetes mellitus, schizophrenia, with history of tardive dyskinesia, who presents for chief concerns of leg giving out and landing on his left side.  9/6- s/p HEMIARTHROPLASTY (BIPOLAR) HIP, POSTERIOR APPROACH FOR FRACTURE: [phone] (CPT)   Reviewed I/O's: -130 ml x 24 hours and -275 ml since admission  UOP: 550 ml x 24 hours  Pt sitting up in bed, pleasant and in good spirits today. Pt shares he has a good appetite, consuming all of his breakfast.   PTA, pt reports that things were not going very well. He reports her has resided at a group home for approximately 14 years. He does not like the food provided to him and reports he has been purchasing his own food (mainly cereal) to eat. Due to limited oral intake, he endorses wt loss, but unsure how much.   Reviewed wt hx; pt has experienced a 17.4% wt loss over the past 2 months, which is significant for time frame.   Discussed importance of good meal and supplement intake to promote healing. Pt amenable to supplements, stating he has been drinking Glucerna.    Medications reviewed and include lovenox , senokot, and thiamine .   Per TOC notes, plan for SNF placement at discharge.   Labs reviewed: CBGS: 136-315 (inpatient orders for glycemic control are 0-5 units insulin  aspart daily at bedtime and0-9 units insulin  aspart TID with meals).    NUTRITION - FOCUSED PHYSICAL EXAM:  Flowsheet Row Most Recent Value  Orbital Region Mild depletion  Upper Arm Region Moderate depletion  Thoracic and Lumbar Region Mild depletion  Buccal Region Mild depletion  Temple Region Moderate depletion  Clavicle Bone Region Moderate depletion  Clavicle and Acromion Bone Region Moderate depletion  Scapular Bone Region Moderate depletion  Dorsal Hand Moderate depletion  Patellar Region Moderate depletion  Anterior Thigh Region Moderate depletion  Posterior Calf Region Moderate depletion  Edema (RD Assessment) None  Hair Reviewed  Eyes Reviewed  Mouth Reviewed  Skin Reviewed  Nails Reviewed    Diet Order:   Diet Order             Diet Carb Modified Fluid consistency: Thin  Diet effective now                   EDUCATION NEEDS:   Education needs have been addressed  Skin:  Skin Assessment: Skin Integrity Issues: Skin Integrity Issues:: Incisions Incisions: closed lt hip  Last BM:  03/21/24  Height:   Ht Readings from Last 1 Encounters:  03/22/24 5' 9 (1.753 m)    Weight:   Wt Readings from Last 1 Encounters:  03/22/24 65 kg    Ideal Body Weight:  72.7 kg  BMI:  Body mass index is 21.16  kg/m.  Estimated Nutritional Needs:   Kcal:  1.9-2.1 L  Protein:  100-115 grams  Fluid:  1.9-2,1 L    Margery ORN, RD, LDN, CDCES Registered Dietitian III Certified Diabetes Care and Education Specialist If unable to reach this RD, please use RD Inpatient group chat on secure chat between hours of 8am-4 pm daily

## 2024-03-25 NOTE — TOC PASRR Note (Signed)
 RE: VRISHANK MOSTER Date of Birth:  05/25/1951 Date:03/25/24   To Whom It May Concern:  Please be advised that the above-named patient will require a short-term nursing home stay - anticipated 30 days or less for rehabilitation and strengthening.  The plan is for return home.   Calvin Robson, MD

## 2024-03-25 NOTE — Progress Notes (Signed)
 ORTHOPEDIC PROGRESS NOTE  SUBJECTIVE:     The patient is alert and oriented x 3. The patient is resting quietly in bed. The patient reports their pain is controlled with medications.  OBJECTIVE:  Vitals:   03/25/24 0355 03/25/24 0754  BP: (!) 154/66 (!) 160/69  Pulse: 66 69  Resp: 18 17  Temp: 99.1 F (37.3 C) 98.1 F (36.7 C)  SpO2: 97% 97%    The patient's incision is intact with no signs of excessive erythema and no drainage. The patient's bilateral lower extremities are neurovascularly intact.  Their toes are pink and warm with brisk capillary refill time.  Dorsalis pedis and posterior tibial pulse are 2+ and symmetric. The patient's calves are soft, nontender, with a negative Homans test bilaterally. The patient is making good progress with physical therapy.  Labs:  Recent Labs    03/22/24 1232 03/23/24 0939 03/25/24 1024  HGB 11.5* 10.7* 9.7*   Recent Labs    03/23/24 0939 03/25/24 1024  WBC 11.7* 9.0  RBC 3.65* 3.25*  HCT 32.7* 29.3*  PLT 184 187   Recent Labs    03/22/24 1232 03/23/24 0939  NA 139 137  K 3.7 4.0  CL 104 106  CO2 24 23  BUN 34* 36*  CREATININE 1.34* 1.52*  GLUCOSE 137* 124*  CALCIUM  9.1 8.7*   Recent Labs    03/23/24 0939  INR 1.3*      ASSESSMENT:  2 Days Post-Op  Status Post: HEMIARTHROPLASTY (BIPOLAR) HIP, POSTERIOR APPROACH FOR FRACTURE: 72763 (CPT)    The patient's orthopedic condition is stable and they continue to improve daily.   PLAN:  Continue PT to mobilize patient as tolerated. Continue DVT prophylaxis. Change dressing. Discharge planning for home.   Cordella Lamar Knack M.D. 03/25/2024 11:05 AM

## 2024-03-25 NOTE — Progress Notes (Signed)
 Occupational Therapy Treatment Patient Details Name: Calvin Bates MRN: [id number] DOB: 1951/01/16 Today's Date: 03/25/2024   History of present illness Patient is a 73 year old male with ground level fall fracturing left hip. S/p left hip hemiarthroplasty, posterior approach. PMH: HTN, HLD, stroke, DM, schizophrenia, tardive dyskinesia.   OT comments  Calvin Bates was seen for OT/PT co-treatment on this date. Upon arrival to room pt seated in recliner, agreeable to tx. Pt requires MAX A x2 + RW sit<>stand x3 at chair, heavy forward posture unable to stand upright. MIN A self-drinking, cues to use dominant R hand. R sided weakness noted, unclear if new from baseline (grip 3/5, +pronator drift noted, neglect of R hand, R shoulder 2-/5). RN and MD notified and in to assess. MAX A x2 +STEDY for chair>bed t/f. Pt making progress toward goals, will continue to follow POC. Discharge recommendation remains appropriate.        If plan is discharge home, recommend the following:  A lot of help with bathing/dressing/bathroom;A lot of help with walking and/or transfers;Supervision due to cognitive status   Equipment Recommendations  Other (comment) (defer)    Recommendations for Other Services      Precautions / Restrictions Precautions Precautions: Posterior Hip;Fall Precaution Booklet Issued: No Restrictions Weight Bearing Restrictions Per Provider Order: Yes LLE Weight Bearing Per Provider Order: Weight bearing as tolerated       Mobility Bed Mobility Overal bed mobility: Needs Assistance Bed Mobility: Sit to Supine       Sit to supine: Max assist, +2 for physical assistance        Transfers Overall transfer level: Needs assistance Equipment used: Rolling walker (2 wheels) Transfers: Sit to/from Stand Sit to Stand: Max assist, +2 physical assistance, From elevated surface                 Balance Overall balance assessment: Needs assistance Sitting-balance support: Feet  supported Sitting balance-Leahy Scale: Poor Sitting balance - Comments: heavy R lateral lean, progressively worse t/o session (fatigue?)   Standing balance support: Bilateral upper extremity supported, Reliant on assistive device for balance Standing balance-Leahy Scale: Poor                             ADL either performed or assessed with clinical judgement   ADL Overall ADL's : Needs assistance/impaired                                       General ADL Comments: MAX A x2 + STEDY for simulated BSC t/f. MIN A self-drinking, cues to use dominant R hand.     Extremity/Trunk Assessment Upper Extremity Assessment Upper Extremity Assessment: RUE deficits/detail RUE Deficits / Details: MD notified and plan for MRI 2/2 R sided weakness unclear if new from baseline: grip 3/5, +pronator drift noted, neglect of R hand, R shoulder 2-/5            Vision       Perception     Praxis     Communication Communication Communication: No apparent difficulties   Cognition Arousal: Alert Behavior During Therapy: WFL for tasks assessed/performed Cognition: History of cognitive impairments                               Following commands: Impaired Following commands  impaired: Follows one step commands with increased time      Cueing      Exercises      Shoulder Instructions       General Comments      Pertinent Vitals/ Pain       Pain Assessment Pain Assessment: Faces Faces Pain Scale: Hurts little more Pain Location: L hip Pain Descriptors / Indicators: Discomfort Pain Intervention(s): Limited activity within patient's tolerance, Repositioned   Frequency  Min 2X/week        Progress Toward Goals  OT Goals(current goals can now be found in the care plan section)  Progress towards OT goals: Progressing toward goals  Acute Rehab OT Goals OT Goal Formulation: With patient Time For Goal Achievement: 04/07/24 Potential to  Achieve Goals: Good ADL Goals Pt Will Perform Grooming: with supervision;sitting;standing Pt Will Perform Lower Body Dressing: with min assist;sitting/lateral leans;sit to/from stand Pt Will Transfer to Toilet: with min assist;ambulating Pt Will Perform Toileting - Clothing Manipulation and hygiene: with min assist;sitting/lateral leans;sit to/from stand  Plan      Co-evaluation    PT/OT/SLP Co-Evaluation/Treatment: Yes Reason for Co-Treatment: To address functional/ADL transfers PT goals addressed during session: Mobility/safety with mobility OT goals addressed during session: ADL's and self-care      AM-PAC OT 6 Clicks Daily Activity     Outcome Measure   Help from another person eating meals?: A Little Help from another person taking care of personal grooming?: A Little Help from another person toileting, which includes using toliet, bedpan, or urinal?: A Lot Help from another person bathing (including washing, rinsing, drying)?: A Lot Help from another person to put on and taking off regular upper body clothing?: A Little Help from another person to put on and taking off regular lower body clothing?: A Lot 6 Click Score: 15    End of Session Equipment Utilized During Treatment: Rolling walker (2 wheels);Gait belt  OT Visit Diagnosis: Other abnormalities of gait and mobility (R26.89);Muscle weakness (generalized) (M62.81)   Activity Tolerance Patient tolerated treatment well   Patient Left in bed;with call bell/phone within reach;with bed alarm set   Nurse Communication Mobility status;Other (comment) (concern for new RUE weakness - MD and RN in to assess)        Time: 8880-8848 OT Time Calculation (min): 32 min  Charges: OT General Charges $OT Visit: 1 Visit OT Treatments $Self Care/Home Management : 8-22 mins  Elston Slot, M.S. OTR/L  03/25/24, 2:00 PM  ascom [phone]

## 2024-03-25 NOTE — TOC Progression Note (Addendum)
 Transition of Care [facility]) - Progression Note    Patient Details  Name: Calvin Bates MRN: [id number] Date of Birth: 25-Jan-1951  Transition of Care [facility]) CM/SW Contact  Aveion Nguyen E Lindell Tussey, LCSW Phone Number: 03/25/2024, 1:36 PM  Clinical Narrative:    Additional info uploaded to [place] [facility]. PASRR still pending.  Expected Discharge Plan: Group Home Barriers to Discharge: Continued Medical Work up               Expected Discharge Plan and Services       Living arrangements for the past 2 months: Group Home                                       Social Drivers of Health (SDOH) Interventions SDOH Screenings   Food Insecurity: No Food Insecurity (03/22/2024)  Housing: Low Risk  (03/22/2024)  Transportation Needs: No Transportation Needs (03/22/2024)  Utilities: Not At Risk (03/22/2024)  Depression (PHQ2-9): Low Risk  (07/28/2022)  Social Connections: Unknown (03/22/2024)  Tobacco Use: Low Risk  (03/23/2024)    Readmission Risk Interventions     No data to display

## 2024-03-26 ENCOUNTER — Ambulatory Visit: Admitting: Psychiatry

## 2024-03-26 DIAGNOSIS — S72022A Displaced fracture of epiphysis (separation) (upper) of left femur, initial encounter for closed fracture: Secondary | ICD-10-CM | POA: Diagnosis not present

## 2024-03-26 LAB — GLUCOSE, CAPILLARY
Glucose-Capillary: 175 mg/dL — ABNORMAL HIGH (ref 70–99)
Glucose-Capillary: 285 mg/dL — ABNORMAL HIGH (ref 70–99)
Glucose-Capillary: 138 mg/dL — ABNORMAL HIGH (ref 70–99)
Glucose-Capillary: 270 mg/dL — ABNORMAL HIGH (ref 70–99)

## 2024-03-26 MED ORDER — INSULIN GLARGINE 100 UNIT/ML ~~LOC~~ SOLN
5.0000 [IU] | Freq: Every day | SUBCUTANEOUS | Status: DC
  Administered 2024-03-26 – 2024-03-27 (×2): 5 [IU] via SUBCUTANEOUS
  Filled 2024-03-26 (×3): qty 0.05

## 2024-03-26 MED ORDER — ENOXAPARIN SODIUM 40 MG/0.4ML IJ SOSY: 40.0000 mg | PREFILLED_SYRINGE | INTRAMUSCULAR | 0 refills | Status: DC

## 2024-03-26 NOTE — Progress Notes (Signed)
  Subjective: 3 Days Post-Op Procedure(s) (LRB): HEMIARTHROPLASTY (BIPOLAR) HIP, POSTERIOR APPROACH FOR FRACTURE (Left) Patient reports pain as mild.   Patient is well, and has had no acute complaints or problems Plan is to go Skilled nursing facility after hospital stay. Negative for chest pain and shortness of breath Fever: no Gastrointestinal:Negative for nausea and vomiting Reports he is passing gas, BM documented yesterday.  Objective: Vital signs in last 24 hours: Temp:  [98 F (36.7 C)-99.1 F (37.3 C)] 98.1 F (36.7 C) (09/09 0725) Pulse Rate:  [69-81] 69 (09/09 0725) Resp:  [17-18] 18 (09/09 0725) BP: (133-156)/(62-81) 156/81 (09/09 0725) SpO2:  [95 %-98 %] 97 % (09/09 0725)  Intake/Output from previous day:  Intake/Output Summary (Last 24 hours) at 03/26/2024 1117 Last data filed at 03/26/2024 1100 Gross per 24 hour  Intake 1197 ml  Output 1950 ml  Net -753 ml    Intake/Output this shift: Total I/O In: 240 [P.O.:240] Out: -   Labs: Recent Labs    03/25/24 1024  HGB 9.7*   Recent Labs    03/25/24 1024  WBC 9.0  RBC 3.25*  HCT 29.3*  PLT 187   Recent Labs    03/25/24 1024  NA 134*  K 4.6  CL 93*  CO2 20*  BUN 52*  CREATININE 1.67*  GLUCOSE 150*  CALCIUM  7.6*   No results for input(s): LABPT, INR in the last 72 hours.   EXAM General - Patient is Alert, Appropriate, and Oriented Extremity - ABD soft Neurovascular intact Dorsiflexion/Plantar flexion intact Incision: dressing C/D/I No cellulitis present Compartment soft Dressing/Incision - clean, dry, no drainage noted to the left hip dressing Motor Function - intact, moving foot and toes well on exam.  Abdomen soft with intact bowel sounds.  Past Medical History:  Diagnosis Date   Diabetes mellitus without complication (HCC)    Diabetic retinopathy (HCC)    Hyperlipemia    Hypertension    Neuropathy    Psychosis (HCC)    Stroke (HCC)     Assessment/Plan: 3 Days Post-Op  Procedure(s) (LRB): HEMIARTHROPLASTY (BIPOLAR) HIP, POSTERIOR APPROACH FOR FRACTURE (Left) Principal Problem:   Closed left femoral fracture (HCC) Active Problems:   Diabetes mellitus type 2, noninsulin dependent (HCC)   Essential hypertension   Hyperlipidemia   History of stroke   Schizophrenia (HCC)   Pain and swelling of right lower extremity   Malnutrition of moderate degree  Estimated body mass index is 21.16 kg/m as calculated from the following:   Height as of this encounter: 5' 9 (1.753 m).   Weight as of this encounter: 65 kg. Advance diet Up with therapy  Vitals reviewed, no recent fever. Current plan is for d/c to SNF.  Patient was in grouphome prior to injury. Appears patient has had a BM, reports he is passing gas. Continue to work with PT.  Following discharge from hospital continue Lovenox  for 14 days. Staples can be removed by SNF on 04/06/24 by SNF.  Follow-up with [facility] in 6 weeks for x-rays.   DVT Prophylaxis - Lovenox  and TED hose Weight-Bearing as tolerated to left leg  J. Gustavo Level, PA-C [facility] Surgery 03/26/2024, 11:17 AM

## 2024-03-26 NOTE — Progress Notes (Signed)
 Physical Therapy Treatment Patient Details Name: Calvin Bates MRN: [id number] DOB: [birth year] Today's Date: 03/26/2024   History of Present Illness Patient is a 72 year old male with ground level fall fracturing left hip. S/p left hip hemiarthroplasty, posterior approach. PMH: HTN, HLD, stroke, DM, schizophrenia, tardive dyskinesia.    PT Comments  Pt was long sitting in bed upon arrival. RN mobility tech  in room. Pt is alert but does present with some cognition deficits. Unable to recall any of his hip precautions. Re-educated on hip precautions and importance of adhering. Pt feels like he needs to have a BM. Requested to try on [facility] since he was unable on bedpan. Pt requires extensive time and assistance to safely exit R side of bed. Stood 2 x EOB to RW prior to stand pivot to [facility] with RW +2 assist. Pt remains a high fall risk. He was cued for improved technique, sequencing and safety throughout. Overall tolerated session well but remains far form his baseline. DC recs remain appropriate to maximize independence and safety with all ADLs.     If plan is discharge home, recommend the following: Two people to help with walking and/or transfers;A lot of help with bathing/dressing/bathroom;Assistance with cooking/housework;Help with stairs or ramp for entrance;Supervision due to cognitive status     Equipment Recommendations  None recommended by PT       Precautions / Restrictions Precautions Precautions: Posterior Hip;Fall Precaution Booklet Issued: No Recall of Precautions/Restrictions: Impaired Restrictions Weight Bearing Restrictions Per Provider Order: Yes LLE Weight Bearing Per Provider Order: Weight bearing as tolerated     Mobility  Bed Mobility Overal bed mobility: Needs Assistance Bed Mobility: Supine to Sit, Sit to Supine  Supine to sit: Max assist Sit to supine: Max assist   Transfers Overall transfer level: Needs assistance Equipment used: Rolling walker (2  wheels) Transfers: Sit to/from Stand, Bed to chair/wheelchair/BSC Sit to Stand: Max assist, +2 safety/equipment, From elevated surface Stand pivot transfers: Max assist, +2 safety/equipment, From elevated surface  General transfer comment: pt stood EOB 2 x prior to stand pivot to/from [facility]. extensive assistance with 2nd person for safety    Ambulation/Gait  General Gait Details: unable to at this time     Balance Overall balance assessment: Needs assistance Sitting-balance support: Feet supported Sitting balance-Leahy Scale: Fair     Standing balance support: Bilateral upper extremity supported, Reliant on assistive device for balance Standing balance-Leahy Scale: Poor Standing balance comment: external support required with heavy reliance on rolling walker for support. limited standing tolerance       Communication Communication Communication: No apparent difficulties  Cognition Arousal: Alert Behavior During Therapy: WFL for tasks assessed/performed   PT - Cognitive impairments: No family/caregiver present to determine baseline   PT - Cognition Comments: pt is alert but present with cognition defiicts. Cognition seems to come and go throughout session. UNable to recall any of the hip precautions Following commands: Impaired Following commands impaired: Follows one step commands inconsistently    Cueing Cueing Techniques: Verbal cues, Tactile cues, Visual cues     General Comments General comments (skin integrity, edema, etc.): Reviewed precautions and ther ex. pt needs tcs and vcs for correct performance of the HEP      Pertinent Vitals/Pain Pain Assessment Pain Assessment: 0-10 Pain Score: 5  Pain Location: L hip Pain Descriptors / Indicators: Discomfort Pain Intervention(s): Limited activity within patient's tolerance, Monitored during session, Premedicated before session, Repositioned  PT Goals (current goals can now be found in the care plan section)  Acute Rehab PT Goals Patient Stated Goal: rehab Progress towards PT goals: Progressing toward goals    Frequency    Min 2X/week           Co-evaluation     PT goals addressed during session: Mobility/safety with mobility;Balance;Proper use of DME;Strengthening/ROM        AM-PAC PT 6 Clicks Mobility   Outcome Measure  Help needed turning from your back to your side while in a flat bed without using bedrails?: A Lot Help needed moving from lying on your back to sitting on the side of a flat bed without using bedrails?: A Lot Help needed moving to and from a bed to a chair (including a wheelchair)?: A Lot Help needed standing up from a chair using your arms (e.g., wheelchair or bedside chair)?: Total Help needed to walk in hospital room?: Total Help needed climbing 3-5 steps with a railing? : Total 6 Click Score: 9    End of Session   Activity Tolerance: Patient tolerated treatment well Patient left: in bed;with call bell/phone within reach;with bed alarm set Nurse Communication: Mobility status PT Visit Diagnosis: Difficulty in walking, not elsewhere classified (R26.2);Other abnormalities of gait and mobility (R26.89)     Time: 8585-8563 PT Time Calculation (min) (ACUTE ONLY): 22 min  Charges:    $Therapeutic Activity: 8-22 mins PT General Charges $$ ACUTE PT VISIT: 1 Visit                     Rankin Essex PTA 03/26/24, 3:18 PM

## 2024-03-26 NOTE — Progress Notes (Signed)
 PROGRESS NOTE    Calvin Bates  FMW:[id number] DOB: March 10, 1951 DOA: 03/22/2024 PCP: Sampson Ethridge LABOR, MD    Brief Narrative:   73 year old male with history of hypertension, hyperlipidemia, history of stroke, non-insulin -dependent diabetes mellitus, schizophrenia, with history of tardive dyskinesia, who presents ED for chief concerns of leg giving out and landing on his left side.   He reports that earlier this morning he was walking to the restroom when he felt his leg gave out on him and he fell on his left side.     He denies head trauma, loss of consciousness, chest pain, abdominal pain, dysuria, hematuria, shortness of breath, nausea, vomiting, fever, chills.   He endorses decreased range of motion of the left lower extremity.  On exam, it was noted that he had right calf swelling and patient was not able to tell me how long this has been going on, but he states this may be a new thing.  There is also right calf tenderness with palpation.   Assessment & Plan:   Principal Problem:   Closed left femoral fracture (HCC) Active Problems:   Diabetes mellitus type 2, noninsulin dependent (HCC)   Essential hypertension   History of stroke   Pain and swelling of right lower extremity   Hyperlipidemia   Schizophrenia (HCC)   Malnutrition of moderate degree  Closed left femoral fracture (HCC) Orthopedics on board No evidence of syncopal event Status post left hip hemiarthroplasty 9/6.  Tolerated well BM reported 9/8 Plan: Continue to mobilize as tolerated.  Continue pain control.  Continue VTE prophylaxis.  Will need skilled nursing facility.  Bed search initiated.   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  Patient approaching medical readiness for discharge.   Level of care: Telemetry Surgical  Consultants:  Orthopedics  Procedures:  Hip fracture repair  Antimicrobials: None   Subjective: Seen and examined.  Sitting up in chair.  Pain well-controlled.  Objective: Vitals:   03/25/24 2030 03/25/24 2312 03/26/24 0438 03/26/24 0725  BP: (!) 140/62 (!) 152/72 (!) 142/66 (!) 156/81  Pulse: 81 77 76 69  Resp: 18 18 18 18   Temp: 99.1 F (37.3 C) 99 F (37.2 C) 98.3 F (36.8 C) 98.1 F (36.7 C)  TempSrc: Oral Oral Oral Oral  SpO2: 95% 97% 98% 97%  Weight:      Height:        Intake/Output Summary (Last 24 hours) at 03/26/2024 1222 Last data filed at 03/26/2024 1100 Gross per 24 hour  Intake 1197 ml  Output 1950 ml  Net -753 ml   Filed Weights   03/22/24 1129  Weight: 65 kg    Examination:  General exam: NAD Respiratory system: Lungs clear.  Normal work of breathing.  Room air Cardiovascular system: S1-S2, RRR, no murmurs, no pedal edema Gastrointestinal system:, Distention, normal bowel sounds Central nervous system: Alert and oriented.  No focal deficits.  Tongue  fasciculations Extremities: Left hip surgical dressing CDI, gait not assessed Skin: No rashes, lesions or ulcers Psychiatry: Judgement and insight appear normal. Mood & affect flattened.     Data Reviewed: I have personally reviewed following labs and imaging studies  CBC: Recent Labs  Lab 03/22/24 1232 03/23/24 0939 03/25/24 1024  WBC 14.4* 11.7* 9.0  NEUTROABS 12.8* 10.0* 6.5  HGB 11.5* 10.7*  9.7*  HCT 35.0* 32.7* 29.3*  MCV 90.0 89.6 90.2  PLT 207 184 187   Basic Metabolic Panel: Recent Labs  Lab 03/22/24 1232 03/23/24 0939 03/25/24 1024  NA 139 137 134*  K 3.7 4.0 4.6  CL 104 106 93*  CO2 24 23 20*  GLUCOSE 137* 124* 150*  BUN 34* 36* 52*  CREATININE 1.34* 1.52* 1.67*  CALCIUM  9.1 8.7* 7.6*   GFR: Estimated Creatinine Clearance: 36.2 mL/min (A) (by C-G formula based on SCr of 1.67 mg/dL (H)). Liver Function Tests: Recent Labs  Lab 03/22/24 1232  AST 33  ALT 28  ALKPHOS 59  BILITOT 0.8  PROT 6.1*  ALBUMIN 3.5   No results for input(s): LIPASE, AMYLASE in the last 168 hours. No results for input(s): AMMONIA in the last 168 hours. Coagulation Profile: Recent Labs  Lab 03/23/24 0939  INR 1.3*   Cardiac Enzymes: No results for input(s): CKTOTAL, CKMB, CKMBINDEX, TROPONINI in the last 168 hours. BNP (last 3 results) No results for input(s): PROBNP in the last 8760 hours. HbA1C: No results for input(s): HGBA1C in the last 72 hours. CBG: Recent Labs  Lab 03/25/24 0801 03/25/24 1213 03/25/24 1705 03/25/24 2053 03/26/24 0726  GLUCAP 136* 206* 149* 148* 175*   Lipid Profile: No results for input(s): CHOL, HDL, LDLCALC, TRIG, CHOLHDL, LDLDIRECT in the last 72 hours. Thyroid Function Tests: No results for input(s): TSH, T4TOTAL, FREET4, T3FREE, THYROIDAB in the last 72 hours. Anemia Panel: No results for input(s): VITAMINB12, FOLATE, FERRITIN, TIBC, IRON, RETICCTPCT in the last 72 hours. Sepsis Labs: No results for input(s): PROCALCITON, LATICACIDVEN in the last 168 hours.  Recent Results (from the past 240 hours)  MRSA Next Gen by PCR, Nasal     Status: None   Collection Time: 03/23/24  3:48 AM   Specimen: Nasal Mucosa; Nasal Swab  Result Value Ref Range Status   MRSA by PCR Next Gen NOT DETECTED NOT DETECTED Final    Comment: (NOTE) The GeneXpert MRSA Assay (FDA approved for NASAL  specimens only), is one component of a comprehensive MRSA colonization surveillance program. It is not intended to diagnose MRSA infection nor to guide or monitor treatment for MRSA infections. Test performance is not FDA approved in patients less than 18 years old. Performed at [facility], [address], [place], [place] [address]          Radiology Studies: MR BRAIN WO CONTRAST Result Date: 03/25/2024 EXAM: MRI BRAIN WITHOUT CONTRAST 03/25/2024 02:22:13 PM TECHNIQUE: Multiplanar multisequence MRI of the head/brain was performed without the administration of intravenous contrast. COMPARISON: CT head 01/14/2024 CLINICAL HISTORY: Neuro deficit, acute, stroke suspected. presents ED for chief concerns of leg giving out and landing on his left side. He reports that earlier this morning he was walking to the restroom when he felt his leg gave out on him and he fell on his left side. FINDINGS: BRAIN AND VENTRICLES: No acute infarct. No intracranial hemorrhage. No mass. No midline shift. No hydrocephalus. The sella is unremarkable. Normal flow voids. T2/FLAIR intensity in the periventricular and subcortical white matter suggestive  of moderate chronic microvascular ischemic changes. Similar appearance of generalized parenchymal volume loss. Remote infarct in the right corona radiata. Small remote infarcts in the cerebellum. ORBITS: Bilateral lens replacement. SINUSES AND MASTOIDS: Large left mastoid effusion. BONES AND SOFT TISSUES: Normal marrow signal. No acute soft tissue abnormality. IMPRESSION: 1. No acute intracranial abnormality. 2. Moderate chronic microvascular ischemic changes. 3. Generalized parenchymal volume loss. 4. Remote infarct in the right corona radiata and small remote infarcts in the cerebellum. 5. Large left mastoid effusion. Electronically signed by: Donnice Mania MD 03/25/2024 06:17 PM EDT RP Workstation: [id number]        Scheduled Meds:  amLODipine   10 mg Oral  Daily   aspirin  EC  325 mg Oral Daily   atorvastatin   20 mg Oral QHS   enoxaparin  (LOVENOX ) injection  40 mg Subcutaneous Q24H   feeding supplement (GLUCERNA SHAKE)  237 mL Oral TID BM   hydrALAZINE   25 mg Oral BID   insulin  aspart  0-5 Units Subcutaneous QHS   insulin  aspart  0-9 Units Subcutaneous TID WC   lisinopril   40 mg Oral Daily   metoprolol  tartrate  25 mg Oral BID   multivitamin with minerals  1 tablet Oral Daily   OLANZapine   2.5 mg Oral QHS   senna-docusate  1 tablet Oral BID   thiamine   100 mg Oral Daily   Continuous Infusions:   LOS: 4 days   Calvin KATHEE Robson, MD [facility]   If 7PM-7AM, please contact night-coverage  03/26/2024, 12:22 PM

## 2024-03-26 NOTE — TOC Progression Note (Signed)
 Transition of Care [facility]) - Progression Note    Patient Details  Name: Calvin Bates MRN: [id number] Date of Birth: 10-25-1950  [facility]) CM/SW Contact  Carmelita FORBES Carbon, LCSW Phone Number: 03/26/2024, 11:24 AM  Clinical Narrative:    PASRR [id number] E expires 10/9   Expected Discharge Plan: Group Home Barriers to Discharge: Continued Medical Work up               Expected Discharge Plan and Services       Living arrangements for the past 2 months: Group Home                                       Social Drivers of Health (SDOH) Interventions SDOH Screenings   Food Insecurity: No Food Insecurity (03/22/2024)  Housing: Low Risk  (03/22/2024)  Transportation Needs: No Transportation Needs (03/22/2024)  Utilities: Not At Risk (03/22/2024)  Depression (PHQ2-9): Low Risk  (07/28/2022)  Social Connections: Unknown (03/22/2024)  Tobacco Use: Low Risk  (03/23/2024)    Readmission Risk Interventions     No data to display

## 2024-03-26 NOTE — Plan of Care (Signed)
  Problem: Skin Integrity: Goal: Risk for impaired skin integrity will decrease Outcome: Progressing   Problem: Nutrition: Goal: Adequate nutrition will be maintained Outcome: Progressing   Problem: Coping: Goal: Level of anxiety will decrease Outcome: Progressing   Problem: Pain Managment: Goal: General experience of comfort will improve and/or be controlled Outcome: Progressing   Problem: Safety: Goal: Ability to remain free from injury will improve Outcome: Progressing

## 2024-03-26 NOTE — TOC Progression Note (Signed)
 Transition of Care [facility]) - Progression Note    Patient Details  Name: Calvin Bates MRN: [id number] Date of Birth: 05-Feb-1951  [facility]) CM/SW Contact  Racheal LITTIE Schimke, RN Phone Number: 03/26/2024, 9:21 AM  Clinical Narrative: Spoke with patient via phone about SNF preference he prefer that I speak with Niece. Called Verneita, she selected [facility] and [facility]. Called [facility], no bed until Friday, text [facility] and they did have a bed, Auth process started.      Expected Discharge Plan: Group Home Barriers to Discharge: Continued Medical Work up               Expected Discharge Plan and Services       Living arrangements for the past 2 months: Group Home                                       Social Drivers of Health (SDOH) Interventions SDOH Screenings   Food Insecurity: No Food Insecurity (03/22/2024)  Housing: Low Risk  (03/22/2024)  Transportation Needs: No Transportation Needs (03/22/2024)  Utilities: Not At Risk (03/22/2024)  Depression (PHQ2-9): Low Risk  (07/28/2022)  Social Connections: Unknown (03/22/2024)  Tobacco Use: Low Risk  (03/23/2024)    Readmission Risk Interventions     No data to display

## 2024-03-27 DIAGNOSIS — S72022A Displaced fracture of epiphysis (separation) (upper) of left femur, initial encounter for closed fracture: Secondary | ICD-10-CM | POA: Diagnosis not present

## 2024-03-27 LAB — GLUCOSE, CAPILLARY
Glucose-Capillary: 171 mg/dL — ABNORMAL HIGH (ref 70–99)
Glucose-Capillary: 254 mg/dL — ABNORMAL HIGH (ref 70–99)
Glucose-Capillary: 167 mg/dL — ABNORMAL HIGH (ref 70–99)
Glucose-Capillary: 207 mg/dL — ABNORMAL HIGH (ref 70–99)

## 2024-03-27 MED ORDER — SODIUM CHLORIDE 0.9 % IV SOLN: INTRAVENOUS | Status: AC

## 2024-03-27 NOTE — Progress Notes (Signed)
  PROGRESS NOTE    Calvin Bates  FMW:[id number] DOB: 1951-06-19 DOA: 03/22/2024 PCP: Sampson Ethridge LABOR, MD  131A/131A-AA  LOS: 5 days   Brief hospital course:   Assessment & Plan: 73 year old male with history of hypertension, hyperlipidemia, history of stroke, non-insulin -dependent diabetes mellitus, schizophrenia, with history of tardive dyskinesia, who presents ED for chief concerns of leg giving out and landing on his left side.    He reports that earlier this morning he was walking to the restroom when he felt his leg gave out on him and he fell on his left side.     He denies head trauma, loss of consciousness, chest pain, abdominal pain, dysuria, hematuria, shortness of breath, nausea, vomiting, fever, chills.   He endorses decreased range of motion of the left lower extremity.  On exam, it was noted that he had right calf swelling and patient was not able to tell me how long this has been going on, but he states this may be a new thing.  There is also right calf tenderness with palpation.   Closed left femoral fracture (HCC) Status post left hip hemiarthroplasty 9/6.   BM reported 9/8 Plan: --lovenox  for DVT ppx for 14 days after discharge. --Weight-Bearing as tolerated to left leg  --Staples can be removed by SNF on 04/06/24 by SNF.   --Follow-up with [facility] in 6 weeks for x-rays.    Pain and swelling of right lower extremity Vascular ultrasound negative   History of stroke --cont statin   Essential hypertension --cont amlodipine , hydralazine , Lisinopril  and Lopressor    Diabetes mellitus type 2, noninsulin dependent (HCC) Hold oral agents Semglee  5 units nightly --ACHS and SSI   Schizophrenia (HCC) With known fasciculations of the tongue Continue home Zyprexa    Hyperlipidemia Continue Home statin  AKI CKD 3A --Cr 1.34 on presentation, increased to 1.67 this morning. --start gentle MIVF   DVT prophylaxis: Lovenox  SQ Code Status: Full code  Family  Communication:  Level of care: Telemetry Surgical Dispo:   The [facility]  Anticipated d/c is to: SNF rehab Anticipated d/c date is: whenever SNF accepts   Subjective and Interval History:  Pt reported feeling tired, no pain.   Objective: Vitals:   03/26/24 2029 03/27/24 0322 03/27/24 0732 03/27/24 1519  BP: (!) 145/72 (!) 152/64 (!) 159/65 133/73  Pulse: 79 73 79 73  Resp: 18 18 17 17   Temp: 99.1 F (37.3 C) 99.1 F (37.3 C) 98.6 F (37 C) 98 F (36.7 C)  TempSrc: Oral Oral    SpO2: 98% 94% 95% 97%  Weight:      Height:        Intake/Output Summary (Last 24 hours) at 03/27/2024 2043 Last data filed at 03/27/2024 2032 Gross per 24 hour  Intake 720 ml  Output 2300 ml  Net -1580 ml   Filed Weights   03/22/24 1129  Weight: 65 kg    Examination:   Constitutional: NAD, AAOx3 HEENT: conjunctivae and lids normal, EOMI CV: No cyanosis.   RESP: normal respiratory effort, on RA Neuro: II - XII grossly intact.   Psych: Normal mood and affect.  Appropriate judgement and reason   Data Reviewed: I have personally reviewed labs and imaging studies  Time spent: 50 minutes  Ellouise Haber, MD [facility] If 7PM-7AM, please contact night-coverage 03/27/2024, 8:43 PM

## 2024-03-27 NOTE — Progress Notes (Signed)
  Subjective: 4 Days Post-Op Procedure(s) (LRB): HEMIARTHROPLASTY (BIPOLAR) HIP, POSTERIOR APPROACH FOR FRACTURE (Left) Patient reports pain as mild.   Patient is well, and has had no acute complaints or problems Plan is to go Skilled nursing facility after hospital stay. Negative for chest pain and shortness of breath Fever: no Gastrointestinal:Negative for nausea and vomiting Reports he is passing gas, BM documented.  Objective: Vital signs in last 24 hours: Temp:  [98.8 F (37.1 C)-99.1 F (37.3 C)] 99.1 F (37.3 C) (09/10 0322) Pulse Rate:  [71-79] 73 (09/10 0322) Resp:  [18] 18 (09/10 0322) BP: (119-152)/(64-72) 152/64 (09/10 0322) SpO2:  [94 %-98 %] 94 % (09/10 0322)  Intake/Output from previous day:  Intake/Output Summary (Last 24 hours) at 03/27/2024 0727 Last data filed at 03/27/2024 0500 Gross per 24 hour  Intake 860 ml  Output 2450 ml  Net -1590 ml    Intake/Output this shift: No intake/output data recorded.  Labs: Recent Labs    03/25/24 1024  HGB 9.7*   Recent Labs    03/25/24 1024  WBC 9.0  RBC 3.25*  HCT 29.3*  PLT 187   Recent Labs    03/25/24 1024  NA 134*  K 4.6  CL 93*  CO2 20*  BUN 52*  CREATININE 1.67*  GLUCOSE 150*  CALCIUM  7.6*   No results for input(s): LABPT, INR in the last 72 hours.   EXAM General - Patient is Alert, Appropriate, and Oriented Extremity - ABD soft Neurovascular intact Dorsiflexion/Plantar flexion intact Incision: dressing C/D/I No cellulitis present Compartment soft Dressing/Incision - clean, dry, no drainage noted to the left hip dressing Motor Function - intact, moving foot and toes well on exam.  Abdomen soft with intact bowel sounds.  Past Medical History:  Diagnosis Date   Diabetes mellitus without complication (HCC)    Diabetic retinopathy (HCC)    Hyperlipemia    Hypertension    Neuropathy    Psychosis (HCC)    Stroke (HCC)     Assessment/Plan: 4 Days Post-Op Procedure(s)  (LRB): HEMIARTHROPLASTY (BIPOLAR) HIP, POSTERIOR APPROACH FOR FRACTURE (Left) Principal Problem:   Closed left femoral fracture (HCC) Active Problems:   Diabetes mellitus type 2, noninsulin dependent (HCC)   Essential hypertension   Hyperlipidemia   History of stroke   Schizophrenia (HCC)   Pain and swelling of right lower extremity   Malnutrition of moderate degree  Estimated body mass index is 21.16 kg/m as calculated from the following:   Height as of this encounter: 5' 9 (1.753 m).   Weight as of this encounter: 65 kg. Advance diet Up with therapy  Vitals reviewed, Temp 99.1.  No signs of infection to the left hip. Current plan is for d/c to SNF.  Patient was in grouphome prior to injury. Appears patient has had a BM, reports he is passing gas. Continue to work with PT.  Following discharge from hospital continue Lovenox  for 14 days. Staples can be removed by SNF on 04/06/24 by SNF.  Follow-up with [facility] in 6 weeks for x-rays.  DVT Prophylaxis - Lovenox  and TED hose Weight-Bearing as tolerated to left leg  J. Gustavo Level, PA-C [facility] 03/27/2024, 7:27 AM

## 2024-03-27 NOTE — Plan of Care (Signed)
  Problem: Pain Managment: Goal: General experience of comfort will improve and/or be controlled Outcome: Progressing   Problem: Safety: Goal: Ability to remain free from injury will improve Outcome: Progressing   Problem: Skin Integrity: Goal: Risk for impaired skin integrity will decrease Outcome: Progressing

## 2024-03-27 NOTE — Plan of Care (Signed)
  Problem: Education: Goal: Ability to describe self-care measures that may prevent or decrease complications (Diabetes Survival Skills Education) will improve Outcome: Progressing   Problem: Fluid Volume: Goal: Ability to maintain a balanced intake and output will improve Outcome: Progressing

## 2024-03-27 NOTE — Progress Notes (Signed)
  Subjective: 4 Days Post-Op Procedure(s) (LRB): HEMIARTHROPLASTY (BIPOLAR) HIP, POSTERIOR APPROACH FOR FRACTURE (Left) Patient reports pain as mild.   Patient is well, and has had no acute complaints or problems Plan is to go Skilled nursing facility after hospital stay. Negative for chest pain and shortness of breath Fever: no Gastrointestinal:Negative for nausea and vomiting + BM last night  Objective: Vital signs in last 24 hours: Temp:  [98.6 F (37 C)-99.1 F (37.3 C)] 98.6 F (37 C) (09/10 0732) Pulse Rate:  [71-79] 79 (09/10 0732) Resp:  [17-18] 17 (09/10 0732) BP: (119-159)/(64-72) 159/65 (09/10 0732) SpO2:  [94 %-98 %] 95 % (09/10 0732)  Intake/Output from previous day:  Intake/Output Summary (Last 24 hours) at 03/27/2024 0745 Last data filed at 03/27/2024 0500 Gross per 24 hour  Intake 860 ml  Output 2450 ml  Net -1590 ml    Intake/Output this shift: No intake/output data recorded.  Labs: Recent Labs    03/25/24 1024  HGB 9.7*   Recent Labs    03/25/24 1024  WBC 9.0  RBC 3.25*  HCT 29.3*  PLT 187   Recent Labs    03/25/24 1024  NA 134*  K 4.6  CL 93*  CO2 20*  BUN 52*  CREATININE 1.67*  GLUCOSE 150*  CALCIUM  7.6*   No results for input(s): LABPT, INR in the last 72 hours.   EXAM General - Patient is Alert, Appropriate, and Oriented Extremity - ABD soft Neurovascular intact Dorsiflexion/Plantar flexion intact Incision: dressing C/D/I No cellulitis present Compartment soft Dressing/Incision - clean, dry, no drainage noted to the left hip dressing Motor Function - intact, moving foot and toes well on exam.    Past Medical History:  Diagnosis Date   Diabetes mellitus without complication (HCC)    Diabetic retinopathy (HCC)    Hyperlipemia    Hypertension    Neuropathy    Psychosis (HCC)    Stroke (HCC)     Assessment/Plan: 4 Days Post-Op Procedure(s) (LRB): HEMIARTHROPLASTY (BIPOLAR) HIP, POSTERIOR APPROACH FOR FRACTURE  (Left) Principal Problem:   Closed left femoral fracture (HCC) Active Problems:   Diabetes mellitus type 2, noninsulin dependent (HCC)   Essential hypertension   Hyperlipidemia   History of stroke   Schizophrenia (HCC)   Pain and swelling of right lower extremity   Malnutrition of moderate degree  Estimated body mass index is 21.16 kg/m as calculated from the following:   Height as of this encounter: 5' 9 (1.753 m).   Weight as of this encounter: 65 kg. Advance diet Up with therapy Pain well controlled Labs and VSS, Hgb 9.7, stable. Afebrile. CM to assist with d/c to SNF.  Patient was in grouphome prior to injury.  Following discharge from hospital continue Lovenox  for 14 days. Staples can be removed by SNF on 04/06/24 by SNF.  Follow-up with [facility] in 6 weeks for x-rays.  DVT Prophylaxis - Lovenox  and TED hose Weight-Bearing as tolerated to left leg  T. Medford Amber, PA-C [facility] Orthopaedic Surgery 03/27/2024, 7:45 AM

## 2024-03-28 DIAGNOSIS — S72022A Displaced fracture of epiphysis (separation) (upper) of left femur, initial encounter for closed fracture: Secondary | ICD-10-CM | POA: Diagnosis not present

## 2024-03-28 LAB — CREATININE, SERUM
Creatinine, Ser: 1.52 mg/dL — ABNORMAL HIGH (ref 0.61–1.24)
GFR, Estimated: 48 mL/min — ABNORMAL LOW (ref >60)

## 2024-03-28 LAB — GLUCOSE, CAPILLARY
Glucose-Capillary: 159 mg/dL — ABNORMAL HIGH (ref 70–99)
Glucose-Capillary: 227 mg/dL — ABNORMAL HIGH (ref 70–99)
Glucose-Capillary: 162 mg/dL — ABNORMAL HIGH (ref 70–99)

## 2024-03-28 MED ORDER — GLUCERNA SHAKE PO LIQD: 237.0000 mL | Freq: Three times a day (TID) | ORAL | Status: AC

## 2024-03-28 MED ORDER — POLYETHYLENE GLYCOL 3350 17 G PO PACK: 17.0000 g | PACK | Freq: Every day | ORAL | Status: AC | PRN

## 2024-03-28 MED ORDER — AMLODIPINE BESYLATE 10 MG PO TABS: 10.0000 mg | ORAL_TABLET | Freq: Every day | ORAL | Status: AC

## 2024-03-28 MED ORDER — ENOXAPARIN SODIUM 40 MG/0.4ML IJ SOSY: 40.0000 mg | PREFILLED_SYRINGE | INTRAMUSCULAR | Status: AC

## 2024-03-28 NOTE — TOC Progression Note (Signed)
 Transition of Care [facility]) - Progression Note    Patient Details  Name: Calvin Bates MRN: [id number] Date of Birth: 1951-01-20  Transition of Care [facility]) CM/SW Contact  Alvaro Louder, [place] Phone Number: 03/28/2024, 1:33 PM  Clinical Narrative:   [facility] received insurance approval for patient to admit to SNF [facility]. LCSWA confirmed with MD that patient is stable for discharge. LCSWA notified the patient and they are in agreement with discharge. LCSWA confirmed bed is available at SNF. Transport arranged with [facility] for next available.  Rm [id number], [phone]   TOC signing off     Expected Discharge Plan: Group Home Barriers to Discharge: No Barriers Identified               Expected Discharge Plan and Services       Living arrangements for the past 2 months: Group Home Expected Discharge Date: 03/28/24                                     Social Drivers of Health (SDOH) Interventions SDOH Screenings   Food Insecurity: No Food Insecurity (03/22/2024)  Housing: Low Risk  (03/22/2024)  Transportation Needs: No Transportation Needs (03/22/2024)  Utilities: Not At Risk (03/22/2024)  Depression (PHQ2-9): Low Risk  (07/28/2022)  Social Connections: Unknown (03/22/2024)  Tobacco Use: Low Risk  (03/23/2024)    Readmission Risk Interventions     No data to display

## 2024-03-28 NOTE — Progress Notes (Signed)
 Physical Therapy Treatment Patient Details Name: Calvin Bates MRN: [id number] DOB: 06/29/1951 Today's Date: 03/28/2024   History of Present Illness Patient is a 73 year old male with ground level fall fracturing left hip. S/p left hip hemiarthroplasty, posterior approach. PMH: HTN, HLD, stroke, DM, schizophrenia, tardive dyskinesia.    PT Comments  Patient agreeable to PT session. He continues to require significant assistance with mobility. Poor standing balance with maximal assistance required to maintain upright and midline for brief period. Unable to walk at this time. Activity tolerance limited by fatigue and pain. Recommend to continue PT to maximize independence and decrease caregiver burden. Rehabilitation < 3 hours/day recommended after this hospital stay.    If plan is discharge home, recommend the following: Two people to help with walking and/or transfers;A lot of help with bathing/dressing/bathroom;Assistance with cooking/housework;Help with stairs or ramp for entrance;Supervision due to cognitive status   Can travel by private vehicle     No  Equipment Recommendations  None recommended by PT    Recommendations for Other Services       Precautions / Restrictions Precautions Precautions: Posterior Hip;Fall Precaution Booklet Issued: No Recall of Precautions/Restrictions: Impaired Restrictions Weight Bearing Restrictions Per Provider Order: Yes LLE Weight Bearing Per Provider Order: Weight bearing as tolerated     Mobility  Bed Mobility Overal bed mobility: Needs Assistance Bed Mobility: Rolling Rolling: Max assist   Supine to sit: Max assist Sit to supine: Max assist   General bed mobility comments: cues for sequencing. rolling to L and R for pericare following bowel movement in the bed. maximal assistance for pericare needed.    Transfers Overall transfer level: Needs assistance Equipment used: Rolling walker (2 wheels) Transfers: Sit to/from Stand Sit to  Stand: Max assist, Via lift equipment           General transfer comment: one partial standing bout performed with maximal effort. cues for weight shifting and hand placement. patient unable to stand again despite several attempts and is fatigued with minimal activity. he expressed fear of falling.    Ambulation/Gait               General Gait Details: unable to at this time   Stairs             Wheelchair Mobility     Tilt Bed    Modified Rankin (Stroke Patients Only)       Balance Overall balance assessment: Needs assistance Sitting-balance support: Feet supported Sitting balance-Leahy Scale: Poor Sitting balance - Comments: right lateral lean with fatigue Postural control: Right lateral lean Standing balance support: Bilateral upper extremity supported, Reliant on assistive device for balance Standing balance-Leahy Scale: Zero Standing balance comment: external support required with heavy reliance on rolling walker and bed for posterior leg support                            Communication Communication Communication: No apparent difficulties  Cognition Arousal: Alert Behavior During Therapy: WFL for tasks assessed/performed   PT - Cognitive impairments: No family/caregiver present to determine baseline, Initiation, Sequencing                       PT - Cognition Comments: cues for sequending, task initiation. Following commands: Impaired Following commands impaired: Follows one step commands inconsistently    Cueing Cueing Techniques: Verbal cues, Tactile cues, Visual cues  Exercises      General Comments  Pertinent Vitals/Pain Pain Assessment Pain Assessment: Faces Faces Pain Scale: Hurts a little bit Pain Location: L hip Pain Descriptors / Indicators: Discomfort Pain Intervention(s): Limited activity within patient's tolerance, Monitored during session, Repositioned    Home Living                           Prior Function            PT Goals (current goals can now be found in the care plan section) Acute Rehab PT Goals Patient Stated Goal: to feel better PT Goal Formulation: With patient Time For Goal Achievement: 04/07/24 Potential to Achieve Goals: Good Progress towards PT goals: Progressing toward goals    Frequency    Min 2X/week      PT Plan      Co-evaluation              AM-PAC PT 6 Clicks Mobility   Outcome Measure  Help needed turning from your back to your side while in a flat bed without using bedrails?: A Lot Help needed moving from lying on your back to sitting on the side of a flat bed without using bedrails?: A Lot Help needed moving to and from a bed to a chair (including a wheelchair)?: A Lot Help needed standing up from a chair using your arms (e.g., wheelchair or bedside chair)?: Total Help needed to walk in hospital room?: Total Help needed climbing 3-5 steps with a railing? : Total 6 Click Score: 9    End of Session   Activity Tolerance: Patient limited by fatigue Patient left: in bed;with bed alarm set;with call bell/phone within reach;with SCD's reapplied Nurse Communication: Mobility status PT Visit Diagnosis: Difficulty in walking, not elsewhere classified (R26.2);Other abnormalities of gait and mobility (R26.89)     Time: 8868-8845 PT Time Calculation (min) (ACUTE ONLY): 23 min  Charges:    $Therapeutic Activity: 23-37 mins PT General Charges $$ ACUTE PT VISIT: 1 Visit                     Randine Essex, PT, MPT    [name] 03/28/2024, 12:56 PM

## 2024-03-28 NOTE — Plan of Care (Signed)
  Problem: Education: Goal: Ability to describe self-care measures that may prevent or decrease complications (Diabetes Survival Skills Education) will improve Outcome: Progressing   Problem: Coping: Goal: Ability to adjust to condition or change in health will improve Outcome: Progressing   Problem: Health Behavior/Discharge Planning: Goal: Ability to identify and utilize available resources and services will improve Outcome: Progressing   Problem: Metabolic: Goal: Ability to maintain appropriate glucose levels will improve Outcome: Progressing   Problem: Skin Integrity: Goal: Risk for impaired skin integrity will decrease 03/28/2024 0638 by Lucile Burnard LABOR, RN Outcome: Progressing 03/28/2024 0542 by Lucile Burnard LABOR, RN Outcome: Progressing

## 2024-03-28 NOTE — Progress Notes (Signed)
  Subjective: 5 Days Post-Op Procedure(s) (LRB): HEMIARTHROPLASTY (BIPOLAR) HIP, POSTERIOR APPROACH FOR FRACTURE (Left) Patient reports pain as mild in the left hip. Patient is well, and has had no acute complaints or problems Plan is to go Skilled nursing facility after hospital stay. Negative for chest pain and shortness of breath Fever: no Gastrointestinal:Negative for nausea and vomiting Reports he is passing gas, BM documented.  Objective: Vital signs in last 24 hours: Temp:  [98 F (36.7 C)-99.1 F (37.3 C)] 98.2 F (36.8 C) (09/11 0516) Pulse Rate:  [73-88] 82 (09/11 0516) Resp:  [17-18] 18 (09/11 0516) BP: (133-162)/(60-73) 162/68 (09/11 0516) SpO2:  [95 %-97 %] 95 % (09/11 0516)  Intake/Output from previous day:  Intake/Output Summary (Last 24 hours) at 03/28/2024 0633 Last data filed at 03/28/2024 0527 Gross per 24 hour  Intake 960 ml  Output 2825 ml  Net -1865 ml    Intake/Output this shift: Total I/O In: 240 [P.O.:240] Out: 1725 [Urine:1725]  Labs: Recent Labs    03/25/24 1024  HGB 9.7*   Recent Labs    03/25/24 1024  WBC 9.0  RBC 3.25*  HCT 29.3*  PLT 187   Recent Labs    03/25/24 1024  NA 134*  K 4.6  CL 93*  CO2 20*  BUN 52*  CREATININE 1.67*  GLUCOSE 150*  CALCIUM  7.6*   No results for input(s): LABPT, INR in the last 72 hours.   EXAM General - Patient is Alert, Appropriate, and Oriented Extremity - ABD soft Neurovascular intact Dorsiflexion/Plantar flexion intact Incision: dressing C/D/I No cellulitis present Compartment soft Dressing/Incision - clean, dry, no drainage noted to the left hip dressing Motor Function - intact, moving foot and toes well on exam.  Abdomen soft with intact bowel sounds.  Past Medical History:  Diagnosis Date   Diabetes mellitus without complication (HCC)    Diabetic retinopathy (HCC)    Hyperlipemia    Hypertension    Neuropathy    Psychosis (HCC)    Stroke (HCC)      Assessment/Plan: 5 Days Post-Op Procedure(s) (LRB): HEMIARTHROPLASTY (BIPOLAR) HIP, POSTERIOR APPROACH FOR FRACTURE (Left) Principal Problem:   Closed left femoral fracture (HCC) Active Problems:   Diabetes mellitus type 2, noninsulin dependent (HCC)   Essential hypertension   Hyperlipidemia   History of stroke   Schizophrenia (HCC)   Pain and swelling of right lower extremity   Malnutrition of moderate degree  Estimated body mass index is 21.16 kg/m as calculated from the following:   Height as of this encounter: 5' 9 (1.753 m).   Weight as of this encounter: 65 kg. Advance diet Up with therapy  Vitals reviewed, Temp 98.2.  No signs of infection to the left hip. Current plan is for d/c to SNF.  Patient was in grouphome prior to injury. Patient has had a BM since surgery. Continue to work with PT.  Following discharge from hospital continue Lovenox  for 14 days. Staples can be removed by SNF on 04/06/24 by SNF.  Follow-up with [facility] in 6 weeks for x-rays.  Ortho will sign off at this time.  DVT Prophylaxis - Lovenox  and TED hose Weight-Bearing as tolerated to left leg  J. Gustavo Level, PA-C [facility] Orthopaedic Surgery 03/28/2024, 6:33 AM

## 2024-03-28 NOTE — Discharge Summary (Signed)
 Physician Discharge Summary   Calvin Bates  male DOB: Apr 03, 1951  FMW:[id number]  PCP: Sampson Ethridge LABOR, MD  Admit date: 03/22/2024 Discharge date: 03/28/2024  Admitted From:  [facility]  Disposition:  SNF rehab CODE STATUS: Full code  Discharge Instructions     Discharge wound care:   Complete by: As directed    --Weight-Bearing as tolerated to left leg  --Staples can be removed by SNF on 04/06/24 by SNF.   --Follow-up with [facility] in 6 weeks for x-rays.  [facility] Course:  For full details, please see H&P, progress notes, consult notes and ancillary notes.  Briefly,  Calvin Bates is a 73 year old male with history of hypertension, history of stroke, non-insulin -dependent diabetes mellitus, schizophrenia, tardive dyskinesia, who presented to ED for chief concerns of leg giving out and landing on his left side while walking to the restroom.     He denies head trauma, loss of consciousness.      Closed left femoral fracture (HCC) Status post left hip hemiarthroplasty 9/6.   BM reported 9/8 --lovenox  for DVT ppx for 14 days after discharge. --Weight-Bearing as tolerated to left leg  --Staples can be removed by SNF on 04/06/24 by SNF.   --Follow-up with [facility] in 6 weeks for x-rays.    Pain and swelling of right lower extremity ultrasound negative for DVT   History of stroke --cont statin   Essential hypertension --cont amlodipine , hydralazine , Lisinopril  and Lopressor  --not taking clonidine PTA   Diabetes mellitus type 2, noninsulin dependent (HCC) Received Semglee  5 units nightly and SSI during hospitalization, however, A1c only 6.1, so discharged back on home metformin.   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  Received gentle MIVF.   Unless noted above, medications under STOP list are  ones pt was not taking PTA.  Discharge Diagnoses:  Principal Problem:   Closed left femoral fracture (HCC) Active Problems:   Diabetes mellitus type 2, noninsulin dependent (HCC)   Essential hypertension   History of stroke   Pain and swelling of right lower extremity   Hyperlipidemia   Schizophrenia (HCC)   Malnutrition of moderate degree   30 Day Unplanned Readmission Risk Score    Flowsheet Row ED to Hosp-Admission (Current) from 03/22/2024 in [facility] (1A)  30 Day Unplanned Readmission Risk Score (%) 14.21 Filed at 03/28/2024 1200    This score is the patient's risk of an unplanned readmission within 30 days of being discharged (0 -100%). The score is based on dignosis, age, lab data, medications, orders, and past utilization.   Low:  0-14.9   Medium: 15-21.9   High: 22-29.9   Extreme: 30 and above         Discharge Instructions:  Allergies as of 03/28/2024       Reactions   Seasonal Ic [octacosanol]         Medication List     STOP taking these medications    acetaminophen  325 MG tablet Commonly known as: TYLENOL    albuterol  108 (90 Base) MCG/ACT inhaler Commonly known as: VENTOLIN  HFA   cloNIDine 0.1 MG tablet Commonly known as: CATAPRES   fluticasone  50 MCG/ACT nasal spray Commonly known as: FLONASE    ketorolac 0.5 % ophthalmic solution Commonly known as: ACULAR   loperamide 2 MG capsule Commonly known  as: IMODIUM   magnesium hydroxide 400 MG/5ML suspension Commonly known as: MILK OF MAGNESIA   multivitamin capsule   prednisoLONE acetate 1 % ophthalmic suspension Commonly known as: PRED FORTE       TAKE these medications    amLODipine  10 MG tablet Commonly known as: NORVASC  Take 1 tablet (10 mg total) by mouth at bedtime. What changed: when to take this   atorvastatin  40 MG tablet Commonly known as: LIPITOR Take 20 mg by mouth at bedtime.   Benefiber Powd Take 3 g by mouth in the morning, at  noon, and at bedtime.   Centrum Silver tablet Take 1 tablet by mouth daily.   cholecalciferol 25 MCG (1000 UNIT) tablet Commonly known as: VITAMIN D3 Take 1,000 Units by mouth daily.   co-enzyme Q-10 30 MG capsule Take by mouth daily.   enoxaparin  40 MG/0.4ML injection Commonly known as: LOVENOX  Inject 0.4 mLs (40 mg total) into the skin daily for 14 days.   feeding supplement (GLUCERNA SHAKE) Liqd Take 237 mLs by mouth 3 (three) times daily between meals.   Fish Oil 1000 MG Caps Take 1 capsule by mouth 2 (two) times daily.   gatifloxacin 0.5 % Soln Commonly known as: ZYMAXID Place 1 drop into the right eye 4 (four) times daily.   guaifenesin  100 MG/5ML syrup Commonly known as: ROBITUSSIN Take 200 mg by mouth 3 (three) times daily as needed for cough.   hydrALAZINE  25 MG tablet Commonly known as: APRESOLINE  Take 25 mg by mouth 2 (two) times daily.   L-Carnitine 500 MG Tabs Take 1 tablet by mouth daily.   lisinopril  40 MG tablet Commonly known as: ZESTRIL  Take 40 mg by mouth daily.   magnesium oxide 400 MG tablet Commonly known as: MAG-OX Take 1 tablet by mouth 2 (two) times daily.   metFORMIN 500 MG tablet Commonly known as: GLUCOPHAGE Take 500 mg by mouth 2 (two) times daily with a meal. Per MAR take 1.5 tablets in the morning and 2 tabs in the eveniong   metoprolol  tartrate 25 MG tablet Commonly known as: LOPRESSOR  Take 25 mg by mouth 2 (two) times daily.   OLANZapine  2.5 MG tablet Commonly known as: ZYPREXA  Take 1 tablet (2.5 mg total) by mouth at bedtime. What changed: Another medication with the same name was removed. Continue taking this medication, and follow the directions you see here.   [facility] Colon Health Caps Take 1 capsule by mouth daily.   polyethylene glycol 17 g packet Commonly known as: MIRALAX  / GLYCOLAX  Take 17 g by mouth daily as needed for mild constipation.   SOOTHE NIGHT TIME OP Apply 1 strip to eye daily.   tamsulosin 0.4  MG Caps capsule Commonly known as: FLOMAX Take 0.4 mg by mouth daily.   thiamine  100 MG tablet Commonly known as: Vitamin B-1 Take 100 mg by mouth daily.   vitamin C 1000 MG tablet Take 250 mg by mouth daily.               Discharge Care Instructions  (From admission, onward)           Start     Ordered   03/28/24 0000  Discharge wound care:       Comments: --Weight-Bearing as tolerated to left leg  --Staples can be removed by SNF on 04/06/24 by SNF.   --Follow-up with [facility] in 6 weeks for x-rays.  - -   03/28/24 1251  Contact information for follow-up providers     Kip Lynwood Double, PA-C Follow up in 6 week(s).   Specialty: Physician Assistant Why: Repeat x-rays. Staples can be removed by SNF on 04/06/24 Contact information: [address] [place] [address] [phone]         Entzminger, Ethridge LABOR, MD Follow up.   Specialty: Internal Medicine Contact information: [address] [place] [address] [phone]              Contact information for after-discharge care     Destination     [facility] [place], [place]. [place]   Service: Skilled Nursing Contact information: [address] [place]  [address] [phone]                     Allergies  Allergen Reactions   Seasonal Ic [Octacosanol]      The results of significant diagnostics from this hospitalization (including imaging, microbiology, ancillary and laboratory) are listed below for reference.   Consultations:   Procedures/Studies: MR BRAIN WO CONTRAST Result Date: 03/25/2024 EXAM: MRI BRAIN WITHOUT CONTRAST 03/25/2024 02:22:13 PM TECHNIQUE: Multiplanar multisequence MRI of the head/brain was performed without the administration of intravenous contrast. COMPARISON: CT head 01/14/2024 CLINICAL HISTORY: Neuro deficit, acute, stroke suspected. presents ED for chief concerns of leg giving out and landing on his left side.  He reports that earlier this morning he was walking to the restroom when he felt his leg gave out on him and he fell on his left side. FINDINGS: BRAIN AND VENTRICLES: No acute infarct. No intracranial hemorrhage. No mass. No midline shift. No hydrocephalus. The sella is unremarkable. Normal flow voids. T2/FLAIR intensity in the periventricular and subcortical white matter suggestive of moderate chronic microvascular ischemic changes. Similar appearance of generalized parenchymal volume loss. Remote infarct in the right corona radiata. Small remote infarcts in the cerebellum. ORBITS: Bilateral lens replacement. SINUSES AND MASTOIDS: Large left mastoid effusion. BONES AND SOFT TISSUES: Normal marrow signal. No acute soft tissue abnormality. IMPRESSION: 1. No acute intracranial abnormality. 2. Moderate chronic microvascular ischemic changes. 3. Generalized parenchymal volume loss. 4. Remote infarct in the right corona radiata and small remote infarcts in the cerebellum. 5. Large left mastoid effusion. Electronically signed by: Donnice Mania MD 03/25/2024 06:17 PM EDT RP Workstation: [id number]   DG HIP PORT UNILAT WITH PELVIS 1V LEFT Result Date: 03/23/2024 CLINICAL DATA:  Proximal left femoral fracture. EXAM: DG HIP (WITH OR WITHOUT PELVIS) 1V PORT LEFT COMPARISON:  March 22, 2024. FINDINGS: Status post left hip arthroplasty for treatment of proximal femoral fracture. Prosthesis appears to be well situated. Expected postoperative changes are noted in the surrounding soft tissues. IMPRESSION: Status post left hip arthroplasty. Electronically Signed   By: Lynwood Landy Raddle M.D.   On: 03/23/2024 14:32   US  Venous Img Lower Unilateral Right (DVT) Result Date: 03/22/2024 CLINICAL DATA:  Right lower extremity swelling EXAM: RIGHT LOWER EXTREMITY VENOUS DOPPLER ULTRASOUND TECHNIQUE: Gray-scale sonography with compression, as well as color and duplex ultrasound, were performed to evaluate the deep venous system(s) from  the level of the common femoral vein through the popliteal and proximal calf veins. COMPARISON:  None Available. FINDINGS: VENOUS Normal compressibility of the common femoral, superficial femoral, and popliteal veins, as well as the visualized calf veins. Visualized portions of profunda femoral vein and great saphenous vein unremarkable. No filling defects to suggest DVT on grayscale or color Doppler imaging. Doppler waveforms show normal direction of venous flow, normal respiratory plasticity  and response to augmentation. Limited views of the contralateral common femoral vein are unremarkable. OTHER None. Limitations: none IMPRESSION: 1. No evidence of deep venous thrombosis within the right lower extremity. Electronically Signed   By: Ozell Daring M.D.   On: 03/22/2024 16:01   DG Chest Portable 1 View Result Date: 03/22/2024 CLINICAL DATA:  Recent fall sent for medical clearance. EXAM: PORTABLE CHEST 1 VIEW COMPARISON:  June 24, 2023 FINDINGS: The heart size and mediastinal contours are within normal limits. Stable diffuse chronic appearing increased interstitial lung markings are seen. There is no evidence of focal consolidation, pleural effusion or pneumothorax. There are multiple chronic bilateral rib fractures. A chronic fracture of the right humeral head and neck is also noted. Multilevel degenerative changes are seen throughout the thoracic spine. IMPRESSION: Chronic appearing increased interstitial lung markings without evidence of acute or active cardiopulmonary disease. Electronically Signed   By: Suzen Dials M.D.   On: 03/22/2024 13:16   DG Hip Unilat W or Wo Pelvis 2-3 Views Left Result Date: 03/22/2024 CLINICAL DATA:  Status post fall. EXAM: DG HIP (WITH OR WITHOUT PELVIS) 2-3V LEFT COMPARISON:  None Available. FINDINGS: An acute, impacted fracture deformity is seen involving the head and neck of the proximal left femur. A fracture deformity of indeterminate age is also seen involving  the right inferior pubic ramus. There is no evidence of dislocation. Degenerative changes seen involving the left hip in the form of joint space narrowing and acetabular sclerosis. IMPRESSION: 1. Acute fracture of the proximal left femur. 2. Fracture of the right inferior pubic ramus of indeterminate age. Electronically Signed   By: Suzen Dials M.D.   On: 03/22/2024 12:17      Labs: BNP (last 3 results) No results for input(s): BNP in the last 8760 hours. Basic Metabolic Panel: Recent Labs  Lab 03/22/24 1232 03/23/24 0939 03/25/24 1024  NA 139 137 134*  K 3.7 4.0 4.6  CL 104 106 93*  CO2 24 23 20*  GLUCOSE 137* 124* 150*  BUN 34* 36* 52*  CREATININE 1.34* 1.52* 1.67*  CALCIUM  9.1 8.7* 7.6*   Liver Function Tests: Recent Labs  Lab 03/22/24 1232  AST 33  ALT 28  ALKPHOS 59  BILITOT 0.8  PROT 6.1*  ALBUMIN 3.5   No results for input(s): LIPASE, AMYLASE in the last 168 hours. No results for input(s): AMMONIA in the last 168 hours. CBC: Recent Labs  Lab 03/22/24 1232 03/23/24 0939 03/25/24 1024  WBC 14.4* 11.7* 9.0  NEUTROABS 12.8* 10.0* 6.5  HGB 11.5* 10.7* 9.7*  HCT 35.0* 32.7* 29.3*  MCV 90.0 89.6 90.2  PLT 207 184 187   Cardiac Enzymes: No results for input(s): CKTOTAL, CKMB, CKMBINDEX, TROPONINI in the last 168 hours. BNP: Invalid input(s): POCBNP CBG: Recent Labs  Lab 03/27/24 1136 03/27/24 1724 03/27/24 2131 03/28/24 0746 03/28/24 1205  GLUCAP 254* 167* 207* 159* 227*   D-Dimer No results for input(s): DDIMER in the last 72 hours. Hgb A1c No results for input(s): HGBA1C in the last 72 hours. Lipid Profile No results for input(s): CHOL, HDL, LDLCALC, TRIG, CHOLHDL, LDLDIRECT in the last 72 hours. Thyroid function studies No results for input(s): TSH, T4TOTAL, T3FREE, THYROIDAB in the last 72 hours.  Invalid input(s): FREET3 Anemia work up No results for input(s): VITAMINB12, FOLATE,  FERRITIN, TIBC, IRON, RETICCTPCT in the last 72 hours. Urinalysis    Component Value Date/Time   COLORURINE YELLOW (A) 01/14/2024 1715   APPEARANCEUR HAZY (A) 01/14/2024 1715  LABSPEC 1.014 01/14/2024 1715   PHURINE 5.0 01/14/2024 1715   GLUCOSEU >=500 (A) 01/14/2024 1715   HGBUR NEGATIVE 01/14/2024 1715   BILIRUBINUR NEGATIVE 01/14/2024 1715   BILIRUBINUR negative 01/13/2024 1104   KETONESUR NEGATIVE 01/14/2024 1715   PROTEINUR 100 (A) 01/14/2024 1715   UROBILINOGEN 0.2 01/13/2024 1104   NITRITE NEGATIVE 01/14/2024 1715   LEUKOCYTESUR NEGATIVE 01/14/2024 1715   Sepsis Labs Recent Labs  Lab 03/22/24 1232 03/23/24 0939 03/25/24 1024  WBC 14.4* 11.7* 9.0   Microbiology Recent Results (from the past 240 hours)  MRSA Next Gen by PCR, Nasal     Status: None   Collection Time: 03/23/24  3:48 AM   Specimen: Nasal Mucosa; Nasal Swab  Result Value Ref Range Status   MRSA by PCR Next Gen NOT DETECTED NOT DETECTED Final    Comment: (NOTE) The GeneXpert MRSA Assay (FDA approved for NASAL specimens only), is one component of a comprehensive MRSA colonization surveillance program. It is not intended to diagnose MRSA infection nor to guide or monitor treatment for MRSA infections. Test performance is not FDA approved in patients less than 57 years old. Performed at [facility], [address] [place], [place] [address]      Total time spend on discharging this patient, including the last patient exam, discussing the hospital stay, instructions for ongoing care as it relates to all pertinent caregivers, as well as preparing the medical discharge records, prescriptions, and/or referrals as applicable, is 30 minutes.    Ellouise Haber, MD  [facility] 03/28/2024, 12:51 PM

## 2024-03-28 NOTE — Progress Notes (Signed)
 Nurse attempted to call report over to [facility]. Nurse was placed on hold X3 and phone was never picked up.

## 2024-03-28 NOTE — Plan of Care (Incomplete)
  Problem: Coping: Goal: Ability to adjust to condition or change in health will improve Outcome: Progressing   Problem: Metabolic: Goal: Ability to maintain appropriate glucose levels will improve Outcome: Progressing   Problem: Skin Integrity: Goal: Risk for impaired skin integrity will decrease Outcome: Progressing   Problem: Clinical Measurements: Goal: Respiratory complications will improve Outcome: Progressing

## 2024-04-22 NOTE — Progress Notes (Deleted)
 BH MD/PA/NP OP Progress Note  04/22/2024 4:46 PM Calvin Bates  MRN:  [id number]  Chief Complaint: No chief complaint on file.  HPI: *** - since the last visit, he was admitted due to closed left femoral fracture. S/p left hip hemiarthroplasty.  - he is seen last in June 2025  Household: (family home) Marital status:divorced, married three times Number of children:2 (estranged relationship) Employment: unemployed, on disability Education: 9th grade, (He left school having issues with some group of people who causes trouble). obtained GED     Visit Diagnosis: No diagnosis found.  Past Psychiatric History: Please see initial evaluation for full details. I have reviewed the history. No updates at this time.     Past Medical History:  Past Medical History:  Diagnosis Date   Diabetes mellitus without complication (HCC)    Diabetic retinopathy (HCC)    Hyperlipemia    Hypertension    Neuropathy    Psychosis (HCC)    Stroke [facility])     Past Surgical History:  Procedure Laterality Date   BACK SURGERY     HIP ARTHROPLASTY Left 03/23/2024   Procedure: HEMIARTHROPLASTY (BIPOLAR) HIP, POSTERIOR APPROACH FOR FRACTURE;  Surgeon: Calvin Cordella Charleston, MD;  Location: [facility] ORS;  Service: Orthopedics;  Laterality: Left;    Family Psychiatric History: Please see initial evaluation for full details. I have reviewed the history. No updates at this time.     Family History:  Family History  Problem Relation Age of Onset   Diabetes Mother    Diabetes Father    Hypertension Brother     Social History:  Social History   Socioeconomic History   Marital status: Divorced    Spouse name: Not on file   Number of children: 2   Years of education: Not on file   Highest education level: GED or equivalent  Occupational History   Not on file  Tobacco Use   Smoking status: Never   Smokeless tobacco: Never  Substance and Sexual Activity   Alcohol use: No    Alcohol/week: 0.0 standard  drinks of alcohol   Drug use: No   Sexual activity: Not Currently  Other Topics Concern   Not on file  Social History Narrative   Not on file   Social Drivers of Health   Financial Resource Strain: Not on file  Food Insecurity: No Food Insecurity (03/22/2024)   Hunger Vital Sign    Worried About Running Out of Food in the Last Year: Never true    Ran Out of Food in the Last Year: Never true  Transportation Needs: No Transportation Needs (03/22/2024)   PRAPARE - Administrator, Civil Service (Medical): No    Lack of Transportation (Non-Medical): No  Physical Activity: Not on file  Stress: Not on file  Social Connections: Unknown (03/22/2024)   Social Connection and Isolation Panel    Frequency of Communication with Friends and Family: More than three times a week    Frequency of Social Gatherings with Friends and Family: More than three times a week    Attends Religious Services: More than 4 times per year    Active Member of [employer] or Organizations: Yes    Attends Banker Meetings: More than 4 times per year    Marital Status: Patient declined    Allergies:  Allergies  Allergen Reactions   Seasonal Ic [Octacosanol]     Metabolic Disorder Labs: Lab Results  Component Value Date   HGBA1C 6.1 (  H) 03/22/2024   MPG 128 03/22/2024   No results found for: PROLACTIN No results found for: CHOL, TRIG, HDL, CHOLHDL, VLDL, LDLCALC No results found for: TSH  Therapeutic Level Labs: No results found for: LITHIUM No results found for: VALPROATE No results found for: CBMZ  Current Medications: Current Outpatient Medications  Medication Sig Dispense Refill   amLODipine  (NORVASC ) 10 MG tablet Take 1 tablet (10 mg total) by mouth at bedtime.     Ascorbic Acid (VITAMIN C) 1000 MG tablet Take 250 mg by mouth daily.     atorvastatin  (LIPITOR) 40 MG tablet Take 20 mg by mouth at bedtime.     cholecalciferol (VITAMIN D3) 25 MCG (1000 UNIT)  tablet Take 1,000 Units by mouth daily.     co-enzyme Q-10 30 MG capsule Take by mouth daily.     enoxaparin  (LOVENOX ) 40 MG/0.4ML injection Inject 0.4 mLs (40 mg total) into the skin daily for 14 days.     feeding supplement, GLUCERNA SHAKE, (GLUCERNA SHAKE) LIQD Take 237 mLs by mouth 3 (three) times daily between meals.     gatifloxacin (ZYMAXID) 0.5 % SOLN Place 1 drop into the right eye 4 (four) times daily.     guaifenesin  (ROBITUSSIN) 100 MG/5ML syrup Take 200 mg by mouth 3 (three) times daily as needed for cough.     hydrALAZINE  (APRESOLINE ) 25 MG tablet Take 25 mg by mouth 2 (two) times daily.     levOCARNitine (L-CARNITINE) 500 MG TABS Take 1 tablet by mouth daily.     lisinopril  (PRINIVIL ,ZESTRIL ) 40 MG tablet Take 40 mg by mouth daily.     magnesium oxide (MAG-OX) 400 MG tablet Take 1 tablet by mouth 2 (two) times daily.     metFORMIN (GLUCOPHAGE) 500 MG tablet Take 500 mg by mouth 2 (two) times daily with a meal. Per MAR take 1.5 tablets in the morning and 2 tabs in the eveniong     metoprolol  tartrate (LOPRESSOR ) 25 MG tablet Take 25 mg by mouth 2 (two) times daily.     Multiple Vitamins-Minerals (CENTRUM SILVER) tablet Take 1 tablet by mouth daily.     OLANZapine  (ZYPREXA ) 2.5 MG tablet Take 1 tablet (2.5 mg total) by mouth at bedtime. 90 tablet 0   Omega-3 Fatty Acids (FISH OIL) 1000 MG CAPS Take 1 capsule by mouth 2 (two) times daily.     polyethylene glycol (MIRALAX  / GLYCOLAX ) 17 g packet Take 17 g by mouth daily as needed for mild constipation.     Probiotic Product (PHILLIPS COLON HEALTH) CAPS Take 1 capsule by mouth daily.     tamsulosin (FLOMAX) 0.4 MG CAPS capsule Take 0.4 mg by mouth daily.     thiamine  (VITAMIN B-1) 100 MG tablet Take 100 mg by mouth daily.     Wheat Dextrin (BENEFIBER) POWD Take 3 g by mouth in the morning, at noon, and at bedtime.     White Petrolatum-Mineral Oil (SOOTHE NIGHT TIME OP) Apply 1 strip to eye daily.     No current facility-administered  medications for this visit.     Musculoskeletal: Strength & Muscle Tone: within normal limits Gait & Station: normal Patient leans: N/A  Psychiatric Specialty Exam: Review of Systems  There were no vitals taken for this visit.There is no height or weight on file to calculate BMI.  General Appearance: {Appearance:22683}  Eye Contact:  {[facility] EYE CONTACT:22684}  Speech:  Clear and Coherent  Volume:  Normal  Mood:  {B[facility] MOOD:22306}  Affect:  {Affect (  EJJ):77312}  Thought Process:  Coherent  Orientation:  Full (Time, Place, and Person)  Thought Content: Logical   Suicidal Thoughts:  {ST/HT (PAA):22692}  Homicidal Thoughts:  {ST/HT (PAA):22692}  Memory:  Immediate;   Good  Judgement:  {Judgement (PAA):22694}  Insight:  {Insight (PAA):22695}  Psychomotor Activity:  Normal  Concentration:  Concentration: Good and Attention Span: Good  Recall:  Good  Fund of Knowledge: Good  Language: Good  Akathisia:  No  Handed:  Right  AIMS (if indicated): not done  Assets:  Communication Skills Desire for Improvement  ADL's:  Intact  Cognition: WNL  Sleep:  {[facility] GOOD/FAIR/POOR:22877}   Screenings: GAD-7    Flowsheet Row [facility] Visit [facility]/28/20[facility] in [facility]  Total GAD-7 Score 0   PHQ2-9    Flowsheet Row Office Visit from 07/28/2022 in [facility] Office Visit from 05/16/2022 in [facility]  PHQ-2 Total Score 0 0   Flowsheet Row ED to Hosp-Admission (Discharged) from 03/22/2024 in [facility] ([facility] ED from 01/14/2024 in [facility] at [facility] from 01/13/2024 in [facility] at [facility]   C-SSRS RISK CATEGORY No Risk No Risk No Risk     Assessment and Plan:  Calvin Bates is a 73 y.o. year old male with a history of psychosis,  tardive dyskinesia, CVA, diabetes, hyperlipidemia,  hypertension, who presents for follow up appointment for below.    1. Schizophrenia, unspecified type (HCC) History:according to the caregiver at family home, he has been stable for ten years on olanzapine . Dose has been tapered down. Had paranoia when he missed to take it for 2 days in the past  He continues to demonstrate a linear thought process, and he is calm during the visit, which has been consistent since the last visit.  The staff denies any behavior concern except he has nocturia.  Will continue current dose of olanzapine  to target schizophrenia. Discussed with the patient about the increased risk of CVA from olanzapine  for people with dementia.  He was previously advised to be back on aspirin  after discussion with PCP given his prior history of stroke.    2. Tardive dyskinesia The exam is notable for lipsmacking.  He is not interested in VMAT 2 inhibitor, and denies any concern about this.SABRA He will not be a good candidate to discontinue olanzapine  due to relapse in his symptoms as above.  Will continue to assess this.    # high risk medication use He is due for check metabolic panels.  The caregiver agrees to contact primary care to send the results to our office.       Last checked  EKG HR 60, QTc325msec 02/2023  Lipid panels wnl 10/2022  [facility]    # right leg weakness Significant worsening.  Exam is notable for gait disturbances with right leg weakness.  According to the caregiver and the patient, vascular imaging of the leg was performed, and he was discharged from PT (and it worsened since then).  The caregiver agrees to contact PT again to see if he qualifies for the physical therapy again.  Noted that he has primary care visit every 4 weeks at the facility, and they are aware of his current condition.    Plan Continue olanzapine  2.5 mg at night  Next appointment: 10/13 at 11:30, IP   The patient demonstrates the following risk factors  for suicide:  Chronic risk factors for suicide include: psychiatric disorder of schizophrenia . Acute risk factors for suicide include: unemployment. Protective factors for this patient include: positive social support and hope for the future. Considering these factors, the overall suicide risk at this point appears to be low. Patient is appropriate for outpatient follow up.   Collaboration of Care: Collaboration of Care: {BH OP Collaboration of Care:21014065}  Patient/Guardian was advised Release of Information must be obtained prior to any record release in order to collaborate their care with an outside provider. Patient/Guardian was advised if they have not already done so to contact the registration department to sign all necessary forms in order for us  to release information regarding their care.   Consent: Patient/Guardian gives verbal consent for treatment and assignment of benefits for services provided during this visit. Patient/Guardian expressed understanding and agreed to proceed.    Katheren Sleet, MD 04/22/2024, 4:46 PM

## 2024-04-29 ENCOUNTER — Ambulatory Visit: Admitting: Psychiatry
